# Patient Record
Sex: Female | Born: 1975 | Race: White | Hispanic: No | Marital: Married | Smoking: Former smoker
Health system: Southern US, Community
[De-identification: ages and names within clinical notes are randomized; demographics above are authoritative.]

## PROBLEM LIST (undated history)

## (undated) DIAGNOSIS — F32A Depression, unspecified: Secondary | ICD-10-CM

## (undated) DIAGNOSIS — M5030 Other cervical disc degeneration, unspecified cervical region: Secondary | ICD-10-CM

## (undated) DIAGNOSIS — D649 Anemia, unspecified: Secondary | ICD-10-CM

## (undated) DIAGNOSIS — G43909 Migraine, unspecified, not intractable, without status migrainosus: Secondary | ICD-10-CM

## (undated) HISTORY — PX: TUBAL LIGATION: SHX77

## (undated) HISTORY — PX: OTHER SURGICAL HISTORY: SHX169

## (undated) HISTORY — PX: MINOR HEMORRHOIDECTOMY: SHX6238

## (undated) HISTORY — PX: COSMETIC SURGERY: SHX468

## (undated) HISTORY — DX: Anemia, unspecified: D64.9

## (undated) HISTORY — PX: OOPHORECTOMY: SHX86

---

## 2016-03-27 ENCOUNTER — Ambulatory Visit (INDEPENDENT_AMBULATORY_CARE_PROVIDER_SITE_OTHER): Payer: Self-pay

## 2016-03-27 ENCOUNTER — Other Ambulatory Visit: Payer: Self-pay | Admitting: Emergency Medicine

## 2016-03-27 DIAGNOSIS — R52 Pain, unspecified: Secondary | ICD-10-CM

## 2016-03-27 DIAGNOSIS — M25521 Pain in right elbow: Secondary | ICD-10-CM

## 2019-08-15 ENCOUNTER — Emergency Department: Admit: 2019-08-15 | Discharge status: Absent without leave | Payer: Self-pay

## 2019-08-15 ENCOUNTER — Emergency Department (INDEPENDENT_AMBULATORY_CARE_PROVIDER_SITE_OTHER): Payer: 59

## 2019-08-15 ENCOUNTER — Other Ambulatory Visit: Payer: Self-pay

## 2019-08-15 ENCOUNTER — Emergency Department (INDEPENDENT_AMBULATORY_CARE_PROVIDER_SITE_OTHER)
Admission: EM | Admit: 2019-08-15 | Discharge: 2019-08-15 | Disposition: A | Discharge status: Home / Self Care | Payer: 59 | Source: Home / Self Care

## 2019-08-15 DIAGNOSIS — W1839XA Other fall on same level, initial encounter: Secondary | ICD-10-CM

## 2019-08-15 DIAGNOSIS — M25571 Pain in right ankle and joints of right foot: Secondary | ICD-10-CM | POA: Diagnosis not present

## 2019-08-15 DIAGNOSIS — S5011XA Contusion of right forearm, initial encounter: Secondary | ICD-10-CM | POA: Diagnosis not present

## 2019-08-15 DIAGNOSIS — W010XXA Fall on same level from slipping, tripping and stumbling without subsequent striking against object, initial encounter: Secondary | ICD-10-CM

## 2019-08-15 DIAGNOSIS — S93401A Sprain of unspecified ligament of right ankle, initial encounter: Secondary | ICD-10-CM | POA: Diagnosis not present

## 2019-08-15 NOTE — ED Provider Notes (Signed)
[name]    CSN: [id number] Arrival date & time: 08/15/19  1811      History   Chief Complaint Chief Complaint  Patient presents with  . Ankle Pain    HPI Julie Morris is a 44 y.o. female.   HPI  Julie Morris is a 43 y.o. female presenting to UC with c/o Right ankle pain that started earlier today after tripping and falling on a trailer attached to her husband's truck.  She hit the back of her head but denies LOC.  She also hit her Right forearm, mildly aching but ankle pain is most bothersome, aching, 6/10, worse with ambulating.    History reviewed. No pertinent past medical history.  There are no problems to display for this patient.   History reviewed. No pertinent surgical history.  OB History   No obstetric history on file.      Home Medications    Prior to Admission medications   Medication Sig Start Date End Date Taking? Authorizing Provider  Ascorbic Acid (VITAMIN C) 1000 MG tablet Take 1,000 mg by mouth daily.   Yes [provider]  busPIRone (BUSPAR) 15 MG tablet Take 15 mg by mouth 3 (three) times daily.   Yes [provider]  drospirenone-ethinyl estradiol (YAZ) 3-0.02 MG tablet Take 1 tablet by mouth daily.   Yes [provider]  escitalopram (LEXAPRO) 20 MG tablet Take 20 mg by mouth daily.   Yes [provider]  traMADol (ULTRAM) 50 MG tablet Take by mouth every 6 (six) hours as needed.   Yes [provider]  traZODone (DESYREL) 100 MG tablet Take 100 mg by mouth at bedtime.   Yes [provider]    Family History Family History  Problem Relation Age of Onset  . Diabetes Mother   . Hypertension Mother   . Emphysema Father     Social History Social History   Tobacco Use  . Smoking status: Never Smoker  . Smokeless tobacco: Never Used  Substance Use Topics  . Alcohol use: Not Currently  . Drug use: Not on file     Allergies   Patient has no known  allergies.   Review of Systems Review of Systems  Musculoskeletal: Positive for arthralgias and myalgias. Negative for back pain.  Skin: Positive for color change. Negative for wound.  Neurological: Positive for dizziness (initially after fall, has resolved.). Negative for light-headedness and headaches.     Physical Exam Triage Vital Signs ED Triage Vitals  Enc Vitals Group     BP 08/15/19 1835 122/79     Pulse Rate 08/15/19 1835 87     Resp 08/15/19 1835 16     Temp 08/15/19 1835 99.5 F (37.5 C)     Temp Source 08/15/19 1835 Oral     SpO2 08/15/19 1835 99 %     Weight --      Height --      Head Circumference --      Peak Flow --      Pain Score 08/15/19 1830 6     Pain Loc --      Pain Edu? --      Excl. in GC? --    No data found.  Updated Vital Signs BP 122/79 (BP Location: Right Arm)   Pulse 87   Temp 99.5 F (37.5 C) (Oral)   Resp 16   LMP 07/31/2019   SpO2 99%   Visual Acuity Right Eye Distance:   Left  Eye Distance:   Bilateral Distance:    Right Eye Near:   Left Eye Near:    Bilateral Near:     Physical Exam Vitals and nursing note reviewed.  Constitutional:      Appearance: Normal appearance. She is well-developed.  HENT:     Head: Normocephalic and atraumatic.     Right Ear: Tympanic membrane and ear canal normal.     Left Ear: Tympanic membrane and ear canal normal.     Nose: Nose normal.     Mouth/Throat:     Mouth: Mucous membranes are moist.     Pharynx: Oropharynx is clear.  Eyes:     Extraocular Movements: Extraocular movements intact.     Conjunctiva/sclera: Conjunctivae normal.     Pupils: Pupils are equal, round, and reactive to light.  Cardiovascular:     Rate and Rhythm: Normal rate.  Pulmonary:     Effort: Pulmonary effort is normal.  Musculoskeletal:        General: Tenderness present. Normal range of motion.     Cervical back: Normal range of motion.     Comments: Right forearm: mild tenderness to muscles, no bony  tenderness. Full ROM shoulder, elbow, wrist and fingers.  Right ankle: no edema, tenderness to lateral aspect. Full ROM.  No tenderness to foot.  Calf is soft, non-tender.   Skin:    General: Skin is warm and dry.     Capillary Refill: Capillary refill takes less than 2 seconds.     Findings: Bruising present.     Comments: Right forearm: faint ecchymosis.  Right ankle: 70mm abrasion, mild ecchymosis. Tenderness proximal to lateral malleolus.   Neurological:     General: No focal deficit present.     Mental Status: She is alert and oriented to person, place, and time.     Cranial Nerves: No cranial nerve deficit.     Sensory: No sensory deficit.  Psychiatric:        Behavior: Behavior normal.      UC Treatments / Results  Labs (all labs ordered are listed, but only abnormal results are displayed) Labs Reviewed - No data to display  EKG   Radiology DG Ankle Complete Right  Result Date: 08/15/2019 CLINICAL DATA:  Pain and inflammation EXAM: RIGHT ANKLE - COMPLETE 3+ VIEW COMPARISON:  None. FINDINGS: No fracture or malalignment. Mild lateral soft tissue swelling. Ankle mortise is symmetric IMPRESSION: No acute osseous abnormality. Electronically Signed   By: Jasmine Pang M.D.   On: 08/15/2019 19:23    Procedures Procedures (including critical care time)  Medications Ordered in UC Medications - No data to display  Initial Impression / Assessment and Plan / UC Course  I have reviewed the triage vital signs and the nursing notes.  Pertinent labs & imaging results that were available during my care of the patient were reviewed by me and considered in my medical decision making (see chart for details).     Reviewed imaging with pt Reassured pt no fracture, will tx as sprain ASO applied for comfort Pt declined crutches F/u Sports Medicine AVS provided.  Final Clinical Impressions(s) / UC Diagnoses   Final diagnoses:  Sprain of right ankle, unspecified ligament,  initial encounter  Contusion of right forearm, initial encounter  Fall from slip, trip, or stumble, initial encounter     Discharge Instructions      Call to schedule a follow up appointment with Sports Medicine in 1-2 weeks if not improving.  You may  take 500mg  acetaminophen every 4-6 hours or in combination with ibuprofen 400-600mg  every 6-8 hours as needed for pain and inflammation.     ED Prescriptions    None     PDMP not reviewed this encounter.   , Lurene Shadow 08/17/19 [phone]

## 2019-08-15 NOTE — ED Triage Notes (Signed)
Patient presents to Urgent Care with complaints of right ankle pain since earlier today. Patient reports as she was hopping out of hte back of a truck today, she lost her balance and hit her right elbow, right ankle, and back of her head. Pt denies LOC, but did feel dizzy and lay there a while before she got back up. Pt ambulatory upon arrival.

## 2019-08-15 NOTE — Discharge Instructions (Signed)
°  Call to schedule a follow up appointment with Sports Medicine in 1-2 weeks if not improving.  You may take 500mg  acetaminophen every 4-6 hours or in combination with ibuprofen 400-600mg  every 6-8 hours as needed for pain and inflammation.

## 2019-12-11 ENCOUNTER — Emergency Department
Admission: RE | Admit: 2019-12-11 | Discharge: 2019-12-11 | Disposition: A | Discharge status: Home / Self Care | Payer: 59 | Source: Ambulatory Visit | Attending: Family Medicine | Admitting: Family Medicine

## 2019-12-11 ENCOUNTER — Other Ambulatory Visit: Payer: Self-pay

## 2019-12-11 VITALS — BP 129/73 | HR 67 | Temp 99.2°F | Resp 18

## 2019-12-11 DIAGNOSIS — R300 Dysuria: Secondary | ICD-10-CM

## 2019-12-11 DIAGNOSIS — M940 Chondrocostal junction syndrome [Tietze]: Secondary | ICD-10-CM

## 2019-12-11 DIAGNOSIS — J0100 Acute maxillary sinusitis, unspecified: Secondary | ICD-10-CM

## 2019-12-11 HISTORY — DX: Depression, unspecified: F32.A

## 2019-12-11 HISTORY — DX: Other cervical disc degeneration, unspecified cervical region: M50.30

## 2019-12-11 LAB — POCT URINALYSIS DIP (MANUAL ENTRY)
Bilirubin, UA: NEGATIVE
Glucose, UA: NEGATIVE mg/dL
Ketones, POC UA: NEGATIVE mg/dL
Leukocytes, UA: NEGATIVE
Nitrite, UA: NEGATIVE
Protein Ur, POC: NEGATIVE mg/dL
Spec Grav, UA: >=1.03 — AB (ref 1.010–1.025)
Urobilinogen, UA: 0.2 E.U./dL
pH, UA: 6.5 (ref 5.0–8.0)

## 2019-12-11 MED ORDER — ALBUTEROL SULFATE HFA 108 (90 BASE) MCG/ACT IN AERS
2.0000 | INHALATION_SPRAY | Freq: Four times a day (QID) | RESPIRATORY_TRACT | 0 refills | Status: AC | PRN
Start: 2019-12-11 — End: ?

## 2019-12-11 MED ORDER — SULFAMETHOXAZOLE-TRIMETHOPRIM 800-160 MG PO TABS
1.0000 | ORAL_TABLET | Freq: Two times a day (BID) | ORAL | 0 refills | Status: DC
Start: 2019-12-11 — End: 2022-12-01

## 2019-12-11 NOTE — Discharge Instructions (Addendum)
Take plain guaifenesin (1200mg  extended release tabs such as Mucinex) twice daily, with plenty of water, for cough and congestion.  May add Pseudoephedrine (30mg , one or two every 4 to 6 hours) for sinus congestion.  Get adequate rest.   May use Afrin nasal spray (or generic oxymetazoline) each morning for about 5 days and then discontinue.  Also recommend using saline nasal spray several times daily and saline nasal irrigation (AYR is a common brand).  Use Flonase nasal spray each morning after using Afrin nasal spray and saline nasal irrigation. Try warm salt water gargles for sore throat.  Stop all antihistamines (Nyquil, etc) for now, and other non-prescription cough/cold preparations. May take Ibuprofen 200mg , 4 tabs every 8 hours with food for chest/sternum discomfort. May take Delsym Cough Suppressant ("12 Hour Cough Relief") at bedtime for nighttime cough.

## 2019-12-11 NOTE — ED Provider Notes (Signed)
[name]    CSN: [id number] Arrival date & time: 12/11/19  0840      History   Chief Complaint Chief Complaint  Patient presents with  . Dysuria  . Nasal Congestion    HPI Julie Morris is a 44 y.o. female.   Patient complains of cough and nasal congestion that started about 1.5 weeks ago.  She visited her PCP at that time where she had negative flu and COVID tests.  She was prescribed prednisone for 5 days which was somewhat helpful, but she still has sinus congestion with greenish mucous, productive cough, and wheezing. During the past 3 to 4 days she has developed dysuria, urgency, frequency, and low grade fever.  The history is provided by the patient.    Past Medical History:  Diagnosis Date  . Degenerative disc disease, cervical   . Depression     There are no problems to display for this patient.   Past Surgical History:  Procedure Laterality Date  . MINOR HEMORRHOIDECTOMY    . OOPHORECTOMY     R ovary d/t ectopic pregnancy  . TUBAL LIGATION    . tubal reversal      OB History   No obstetric history on file.      Home Medications    Prior to Admission medications   Medication Sig Start Date End Date Taking? Authorizing Provider  Biotin 10 MG TABS Take 1 tablet by mouth daily.   Yes [provider]  DANDELION ROOT PO Take 3 tablets by mouth daily.   Yes [provider]  Ginkgo Biloba 40 MG TABS Take 1 tablet by mouth daily.   Yes [provider]  glucosamine-chondroitin 500-400 MG tablet Take 2 tablets by mouth daily.   Yes [provider]  tiZANidine (ZANAFLEX) 2 MG tablet Take 2 mg by mouth daily as needed for muscle spasms.   Yes [provider]  albuterol (VENTOLIN HFA) 108 (90 Base) MCG/ACT inhaler Inhale 2 puffs into the lungs every 6 (six) hours as needed for wheezing or shortness of breath. 12/11/19   Lattie Haw, MD  Ascorbic Acid (VITAMIN C) 1000 MG tablet Take 1,000 mg by  mouth daily.    [provider]  busPIRone (BUSPAR) 15 MG tablet Take 15 mg by mouth 3 (three) times daily.    [provider]  drospirenone-ethinyl estradiol (YAZ) 3-0.02 MG tablet Take 1 tablet by mouth daily.    [provider]  escitalopram (LEXAPRO) 20 MG tablet Take 20 mg by mouth daily.    [provider]  sulfamethoxazole-trimethoprim (BACTRIM DS) 800-160 MG tablet Take 1 tablet by mouth 2 (two) times daily. 12/11/19   Lattie Haw, MD  traMADol (ULTRAM) 50 MG tablet Take by mouth every 6 (six) hours as needed.    [provider]  traZODone (DESYREL) 100 MG tablet Take 100 mg by mouth at bedtime.    [provider]    Family History Family History  Problem Relation Age of Onset  . Diabetes Mother   . Hypertension Mother   . Emphysema Father     Social History Social History   Tobacco Use  . Smoking status: Former Games developer  . Smokeless tobacco: Never Used  Vaping Use  . Vaping Use: Every day  Substance Use Topics  . Alcohol use: Not Currently  . Drug use: Not Currently     Allergies   Patient has no known allergies.   Review of Systems Review of  Systems  + sore throat + cough No pleuritic pain + wheezing + nasal congestion + post-nasal drainage + sinus pain/pressure No itchy/red eyes No earache No hemoptysis No SOB + low grade fever, + chills No nausea No vomiting No abdominal pain No diarrhea + urinary symptoms No skin rash + fatigue No myalgias No headache Used OTC meds (Nyquil, Daquil, Mucinex) without relief    Physical Exam Triage Vital Signs ED Triage Vitals  Enc Vitals Group     BP 12/11/19 0905 129/73     Pulse Rate 12/11/19 0905 67     Resp 12/11/19 0905 18     Temp 12/11/19 0905 99.2 F (37.3 C)     Temp Source 12/11/19 0905 Oral     SpO2 12/11/19 0905 97 %     Weight --      Height --      Head Circumference --      Peak Flow --      Pain Score 12/11/19 0855 3      Pain Loc --      Pain Edu? --      Excl. in GC? --    No data found.  Updated Vital Signs BP 129/73 (BP Location: Left Arm)   Pulse 67   Temp 99.2 F (37.3 C) (Oral)   Resp 18   LMP  (LMP Unknown)   SpO2 97%   Visual Acuity Right Eye Distance:   Left Eye Distance:   Bilateral Distance:    Right Eye Near:   Left Eye Near:    Bilateral Near:     Physical Exam Nursing notes and Vital Signs reviewed. Appearance:  Patient appears stated age, and in no acute distress Eyes:  Pupils are equal, round, and reactive to light and accomodation.  Extraocular movement is intact.  Conjunctivae are not inflamed  Ears:  Canals normal.  Tympanic membranes normal.  Nose:  Mildly congested turbinates.  Maxillary sinus tenderness is present.  Pharynx:  Normal Neck:  Supple.  Mildly enlarged lateral nodes are present, tender to palpation on the left.   Lungs:  Clear to auscultation.  Breath sounds are equal.  Moving air well. Chest:  Distinct tenderness to palpation over the mid-sternum.  Heart:  Regular rate and rhythm without murmurs, rubs, or gallops.  Abdomen:  Nontender without masses or hepatosplenomegaly.  Bowel sounds are present.  No CVA or flank tenderness.  Extremities:  No edema.  Skin:  No rash present.   UC Treatments / Results  Labs (all labs ordered are listed, but only abnormal results are displayed) Labs Reviewed  POCT URINALYSIS DIP (MANUAL ENTRY) - Abnormal; Notable for the following components:      Result Value   Spec Grav, UA >=1.030 (*)    Blood, UA trace-lysed (*)    All other components within normal limits  URINE CULTURE    EKG   Radiology No results found.  Procedures Procedures (including critical care time)  Medications Ordered in UC Medications - No data to display  Initial Impression / Assessment and Plan / UC Course  I have reviewed the triage vital signs and the nursing notes.  Pertinent labs & imaging results that were available during my  care of the patient were reviewed by me and considered in my medical decision making (see chart for details).    Urine culture pending.  Begin empiric Bactrim DS.  Rx for albuterol inhaler. Followup with Family Doctor if not improved in about 8 days.  Final Clinical Impressions(s) / UC Diagnoses   Final diagnoses:  Dysuria  Acute maxillary sinusitis, recurrence not specified  Costochondritis     Discharge Instructions     Take plain guaifenesin (1200mg  extended release tabs such as Mucinex) twice daily, with plenty of water, for cough and congestion.  May add Pseudoephedrine (30mg , one or two every 4 to 6 hours) for sinus congestion.  Get adequate rest.   May use Afrin nasal spray (or generic oxymetazoline) each morning for about 5 days and then discontinue.  Also recommend using saline nasal spray several times daily and saline nasal irrigation (AYR is a common brand).  Use Flonase nasal spray each morning after using Afrin nasal spray and saline nasal irrigation. Try warm salt water gargles for sore throat.  Stop all antihistamines (Nyquil, etc) for now, and other non-prescription cough/cold preparations. May take Ibuprofen 200mg , 4 tabs every 8 hours with food for chest/sternum discomfort. May take Delsym Cough Suppressant ("12 Hour Cough Relief") at bedtime for nighttime cough.     ED Prescriptions    Medication Sig Dispense Auth. Provider   sulfamethoxazole-trimethoprim (BACTRIM DS) 800-160 MG tablet Take 1 tablet by mouth 2 (two) times daily. 20 tablet , MD   albuterol (VENTOLIN HFA) 108 (90 Base) MCG/ACT inhaler Inhale 2 puffs into the lungs every 6 (six) hours as needed for wheezing or shortness of breath. 8 g , MD        , MD 12/21/19 1041

## 2019-12-11 NOTE — ED Triage Notes (Signed)
Pt presents to Urgent Care with c/o dysuria, low back pain, and low-grade fever x 3- 4 days.  Pt also c/o nasal congestion and occasional cough x 1.5 weeks. She reports productive cough and states she has greenish nasal mucus. She saw her PCP approx 10 days ago and was tested for COVID and flu, and both were negative. She took course of Prednisone x 5 days, which she reports helped some.

## 2019-12-14 LAB — URINE CULTURE
MICRO NUMBER:: 11236333
Result:: NO GROWTH
SPECIMEN QUALITY:: ADEQUATE

## 2020-07-30 DIAGNOSIS — U071 COVID-19: Secondary | ICD-10-CM

## 2020-07-30 HISTORY — DX: COVID-19: U07.1

## 2020-11-05 ENCOUNTER — Other Ambulatory Visit: Payer: Self-pay

## 2020-11-05 ENCOUNTER — Emergency Department
Admission: RE | Admit: 2020-11-05 | Discharge: 2020-11-05 | Disposition: A | Discharge status: Home / Self Care | Payer: 59 | Source: Ambulatory Visit

## 2020-11-05 VITALS — BP 124/84 | HR 78 | Temp 99.3°F | Resp 16 | Ht 67.0 in | Wt 150.0 lb

## 2020-11-05 DIAGNOSIS — J3489 Other specified disorders of nose and nasal sinuses: Secondary | ICD-10-CM | POA: Diagnosis not present

## 2020-11-05 DIAGNOSIS — J0100 Acute maxillary sinusitis, unspecified: Secondary | ICD-10-CM

## 2020-11-05 DIAGNOSIS — J309 Allergic rhinitis, unspecified: Secondary | ICD-10-CM

## 2020-11-05 HISTORY — DX: Migraine, unspecified, not intractable, without status migrainosus: G43.909

## 2020-11-05 MED ORDER — AMOXICILLIN-POT CLAVULANATE 875-125 MG PO TABS: 1.0000 | ORAL_TABLET | Freq: Two times a day (BID) | ORAL | 0 refills | Status: AC

## 2020-11-05 MED ORDER — PREDNISONE 20 MG PO TABS
ORAL_TABLET | ORAL | 0 refills | Status: DC
Start: 2020-11-05 — End: 2020-11-19

## 2020-11-05 MED ORDER — FEXOFENADINE HCL 180 MG PO TABS
180.0000 mg | ORAL_TABLET | Freq: Every day | ORAL | 0 refills | Status: DC
Start: 2020-11-05 — End: 2021-03-26

## 2020-11-05 MED ORDER — ACETAMINOPHEN 325 MG PO TABS
650.0000 mg | ORAL_TABLET | Freq: Once | ORAL | Status: AC
  Administered 2020-11-05: 650 mg via ORAL

## 2020-11-05 NOTE — Discharge Instructions (Addendum)
Instructed patient to discontinue OTC chlorphen-PE-acetaminophen tabs and Flonase now.  Advised patient to take medication as directed with food to completion.  Advised patient to take prednisone burst and Allegra with first dose of antibiotic for 5 of 10 days.  Encouraged patient to increase daily water intake while taking these medications.

## 2020-11-05 NOTE — ED Notes (Signed)
Pt had a question about an injection for knee pain x  3 weeks - provider updated - given information for Sports medicine practices

## 2020-11-05 NOTE — ED Triage Notes (Signed)
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

## 2020-11-05 NOTE — ED Provider Notes (Signed)
[name]    CSN: [id number] Arrival date & time: 11/05/20  0908      History   Chief Complaint Chief Complaint  Patient presents with   Facial Pain    HPI Julie Morris is a 45 y.o. female.   HPI 45 year old female presents with sinus nasal congestion for 1 month.  Past Medical History:  Diagnosis Date   COVID 07/30/2020   no vaccine   Degenerative disc disease, cervical    Depression    Migraine     There are no problems to display for this patient.   Past Surgical History:  Procedure Laterality Date   MINOR HEMORRHOIDECTOMY     OOPHORECTOMY     R ovary d/t ectopic pregnancy   TUBAL LIGATION     tubal reversal      OB History   No obstetric history on file.      Home Medications    Prior to Admission medications   Medication Sig Start Date End Date Taking? Authorizing Provider  amoxicillin-clavulanate (AUGMENTIN) 875-125 MG tablet Take 1 tablet by mouth 2 (two) times daily for 10 days. 11/05/20 11/15/20 Yes Trevor Iha, FNP  baclofen (LIORESAL) 10 MG tablet Take by mouth. 08/22/20 08/22/21 Yes [provider]  celecoxib (CELEBREX) 200 MG capsule Take 1 capsule by mouth 2 (two) times daily. 10/04/20  Yes [provider]  Chlorphen-PE-Acetaminophen 4-10-325 MG TABS Take 1 tablet 4 times daily for sinus drainage/congestion and sinus headache. 07/30/20  Yes [provider]  DULoxetine (CYMBALTA) 60 MG capsule duloxetine 60 mg capsule,delayed release  TAKE 1 CAPSULE BY MOUTH ONCE DAILY 03/23/20  Yes [provider]  fexofenadine (ALLEGRA ALLERGY) 180 MG tablet Take 1 tablet (180 mg total) by mouth daily for 15 days. 11/05/20 11/20/20 Yes Trevor Iha, FNP  fluticasone (FLONASE) 50 MCG/ACT nasal spray Use 1 spray(s) in each nostril once daily 10/10/19  Yes [provider]  predniSONE (DELTASONE) 20 MG tablet Take 3 tabs PO x 5 days. 11/05/20  Yes Trevor Iha, FNP  promethazine (PHENERGAN) 25 MG tablet  Take by mouth. 08/22/20  Yes [provider]  rizatriptan (MAXALT) 10 MG tablet Take by mouth. 08/22/20  Yes [provider]  topiramate (TOPAMAX) 25 MG tablet Take 1 po qhs x 2 weeks, then 2 po qhs x 2 weeks, then 3 po qhs. 08/22/20  Yes [provider]  albuterol (VENTOLIN HFA) 108 (90 Base) MCG/ACT inhaler Inhale 2 puffs into the lungs every 6 (six) hours as needed for wheezing or shortness of breath. Patient not taking: Reported on 11/05/2020 12/11/19   Lattie Haw, MD  Ascorbic Acid (VITAMIN C) 1000 MG tablet Take 1,000 mg by mouth daily.    [provider]  Biotin 10 MG TABS Take 1 tablet by mouth daily.    [provider]  busPIRone (BUSPAR) 15 MG tablet Take 15 mg by mouth 3 (three) times daily. Patient not taking: Reported on 11/05/2020    [provider]  Cholecalciferol 25 MCG (1000 UT) tablet Take by mouth.    [provider]  DANDELION ROOT PO Take 3 tablets by mouth daily. Patient not taking: Reported on 11/05/2020    [provider]  drospirenone-ethinyl estradiol (YAZ) 3-0.02 MG tablet Take 1 tablet by mouth daily.    [provider]  escitalopram (LEXAPRO) 20 MG tablet Take 20 mg by mouth daily. Patient not taking: Reported on 11/05/2020    [provider]  Ginkgo Biloba 40  MG TABS Take 1 tablet by mouth daily.    [provider]  glucosamine-chondroitin 500-400 MG tablet Take 2 tablets by mouth daily. Patient not taking: Reported on 11/05/2020    [provider]  HYDROcodone-acetaminophen (NORCO) 10-325 MG tablet Take 1 tablet by mouth 3 (three) times daily as needed. 10/25/20   [provider]  hydrOXYzine (ATARAX/VISTARIL) 10 MG tablet Take 10 mg by mouth 2 (two) times daily. 10/24/20   [provider]  naloxone (NARCAN) nasal spray 4 mg/0.1 mL Narcan 4 mg/actuation nasal spray  ADMINISTER A SINGLE SPRAY IN ONE NOSTRIL UPON SIGNS OF OPIOID OVERDOSE. CALL  911. REPEAT AFTER 3 MINUTES IF NO RESPONSE.    [provider]  sulfamethoxazole-trimethoprim (BACTRIM DS) 800-160 MG tablet Take 1 tablet by mouth 2 (two) times daily. Patient not taking: Reported on 11/05/2020 12/11/19   Lattie Haw, MD  tiZANidine (ZANAFLEX) 2 MG tablet Take 2 mg by mouth daily as needed for muscle spasms. Patient not taking: Reported on 11/05/2020    [provider]  traMADol (ULTRAM) 50 MG tablet Take by mouth every 6 (six) hours as needed. Patient not taking: Reported on 11/05/2020    [provider]  traZODone (DESYREL) 100 MG tablet Take 100 mg by mouth at bedtime.    [provider]    Family History Family History  Problem Relation Age of Onset   Diabetes Mother    Hypertension Mother    Emphysema Father     Social History Social History   Tobacco Use   Smoking status: Former   Smokeless tobacco: Never  Building services engineer Use: Every day  Substance Use Topics   Alcohol use: Not Currently   Drug use: Not Currently     Allergies   Patient has no known allergies.   Review of Systems Review of Systems  HENT:  Positive for congestion, postnasal drip and sinus pain.   Respiratory:  Positive for cough.   All other systems reviewed and are negative.   Physical Exam Triage Vital Signs ED Triage Vitals  Enc Vitals Group     BP 11/05/20 0931 124/84     Pulse Rate 11/05/20 0931 78     Resp 11/05/20 0931 16     Temp 11/05/20 0931 99.3 F (37.4 C)     Temp Source 11/05/20 0931 Oral     SpO2 11/05/20 0931 97 %     Weight 11/05/20 0934 150 lb (68 kg)     Height 11/05/20 0934 5\' 7"  (1.702 m)     Head Circumference --      Peak Flow --      Pain Score 11/05/20 0934 5     Pain Loc --      Pain Edu? --      Excl. in GC? --    No data found.  Updated Vital Signs BP 124/84 (BP Location: Left Arm)   Pulse 78   Temp 99.3 F (37.4 C) (Oral)   Resp 16   Ht 5\' 7"  (1.702 m)   Wt 150 lb (68 kg)   LMP  10/31/2020 (Approximate)   SpO2 97%   BMI 23.49 kg/m    Physical Exam Vitals and nursing note reviewed.  Constitutional:      General: She is not in acute distress.    Appearance: Normal appearance. She is normal weight. She is not ill-appearing.  HENT:     Head: Normocephalic and atraumatic.  Right Ear: Tympanic membrane, ear canal and external ear normal.     Left Ear: Tympanic membrane, ear canal and external ear normal.     Nose:     Right Sinus: Maxillary sinus tenderness present.     Left Sinus: Maxillary sinus tenderness present.     Mouth/Throat:     Mouth: Mucous membranes are moist.     Pharynx: Oropharynx is clear.     Comments: Moderate amount of clear drainage of posterior oropharynx noted Eyes:     Extraocular Movements: Extraocular movements intact.     Conjunctiva/sclera: Conjunctivae normal.     Pupils: Pupils are equal, round, and reactive to light.  Cardiovascular:     Rate and Rhythm: Normal rate and regular rhythm.     Pulses: Normal pulses.     Heart sounds: Normal heart sounds.  Pulmonary:     Effort: Pulmonary effort is normal.     Breath sounds: Normal breath sounds. No wheezing, rhonchi or rales.  Musculoskeletal:        General: Normal range of motion.     Cervical back: Normal range of motion and neck supple.  Skin:    General: Skin is warm and dry.  Neurological:     General: No focal deficit present.     Mental Status: She is alert and oriented to person, place, and time. Mental status is at baseline.     UC Treatments / Results  Labs (all labs ordered are listed, but only abnormal results are displayed) Labs Reviewed - No data to display  EKG   Radiology No results found.  Procedures Procedures (including critical care time)  Medications Ordered in UC Medications  acetaminophen (TYLENOL) tablet 650 mg (650 mg Oral Given 11/05/20 0946)    Initial Impression / Assessment and Plan / UC Course  I have reviewed the triage  vital signs and the nursing notes.  Pertinent labs & imaging results that were available during my care of the patient were reviewed by me and considered in my medical decision making (see chart for details).     MDM: 1.  Acute maxillary sinusitis-Rx'd Augmentin; 2.  Sinus pressure-Rx'd Prednisone burst; 3.  Allergic rhinitis-to Allegra.  Instructed patient to discontinue OTC chlorphen-PE-acetaminophen tabs and Flonase now.  Advised patient to take medication as directed with food to completion.  Advised patient to take prednisone burst and Allegra with first dose of antibiotic for 5 of 10 days.  Encouraged patient to increase daily water intake while taking these medications.  Patient discharged home, hemodynamically stable. Final Clinical Impressions(s) / UC Diagnoses   Final diagnoses:  Acute maxillary sinusitis, recurrence not specified  Sinus pressure  Allergic rhinitis, unspecified seasonality, unspecified trigger     Discharge Instructions      Instructed patient to discontinue OTC chlorphen-PE-acetaminophen tabs and Flonase now.  Advised patient to take medication as directed with food to completion.  Advised patient to take prednisone burst and Allegra with first dose of antibiotic for 5 of 10 days.  Encouraged patient to increase daily water intake while taking these medications.     ED Prescriptions     Medication Sig Dispense Auth. Provider   amoxicillin-clavulanate (AUGMENTIN) 875-125 MG tablet Take 1 tablet by mouth 2 (two) times daily for 10 days. 20 tablet Trevor Iha, FNP   predniSONE (DELTASONE) 20 MG tablet Take 3 tabs PO x 5 days. 15 tablet Trevor Iha, FNP   fexofenadine [facility] ALLERGY) 180 MG tablet Take 1 tablet (180 mg  total) by mouth daily for 15 days. 15 tablet Trevor Iha, FNP      PDMP not reviewed this encounter.   Trevor Iha, FNP 11/05/20 1027

## 2020-11-19 ENCOUNTER — Other Ambulatory Visit: Payer: Self-pay

## 2020-11-19 ENCOUNTER — Emergency Department (INDEPENDENT_AMBULATORY_CARE_PROVIDER_SITE_OTHER): Payer: 59

## 2020-11-19 ENCOUNTER — Emergency Department (INDEPENDENT_AMBULATORY_CARE_PROVIDER_SITE_OTHER)
Admission: EM | Admit: 2020-11-19 | Discharge: 2020-11-19 | Disposition: A | Discharge status: Home / Self Care | Payer: 59 | Source: Home / Self Care | Attending: Family Medicine | Admitting: Family Medicine

## 2020-11-19 DIAGNOSIS — M25512 Pain in left shoulder: Secondary | ICD-10-CM

## 2020-11-19 DIAGNOSIS — G8929 Other chronic pain: Secondary | ICD-10-CM

## 2020-11-19 MED ORDER — PREDNISONE 20 MG PO TABS: ORAL_TABLET | ORAL | 0 refills | Status: DC
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
## 2020-11-19 NOTE — Discharge Instructions (Signed)
Apply ice pack for 20 to 30 minutes, 3 to 4 times daily  Continue until pain and swelling decrease.  °
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
## 2020-11-20 ENCOUNTER — Ambulatory Visit: Payer: 59

## 2020-11-20 NOTE — ED Provider Notes (Signed)
[name]    CSN: [id number] Arrival date & time: 11/19/20  1952      History   Chief Complaint Chief Complaint  Patient presents with   Shoulder Pain    left    HPI Julie Morris is a 45 y.o. female.   Patient complains of approximately 6 month history of left shoulder pain and limited range of motion, now worse during the past several weeks.  She denies specific injury but admits that she has had several falls.  The pain now affects her sleep.  The history is provided by the patient.  Shoulder Pain Location:  Shoulder Shoulder location:  L shoulder Injury: no   Pain details:    Quality:  Aching   Radiates to:  Does not radiate   Severity:  Severe   Onset quality:  Gradual   Duration:  6 months   Timing:  Constant   Progression:  Worsening Handedness:  Right-handed Dislocation: no   Prior injury to area:  No Relieved by:  Nothing Worsened by:  Movement Ineffective treatments:  Narcotics Associated symptoms: back pain, decreased range of motion and muscle weakness   Associated symptoms: no fatigue, no fever and no numbness    Past Medical History:  Diagnosis Date   COVID 07/30/2020   no vaccine   Degenerative disc disease, cervical    Depression    Migraine     There are no problems to display for this patient.   Past Surgical History:  Procedure Laterality Date   MINOR HEMORRHOIDECTOMY     OOPHORECTOMY     R ovary d/t ectopic pregnancy   TUBAL LIGATION     tubal reversal      OB History   No obstetric history on file.      Home Medications    Prior to Admission medications   Medication Sig Start Date End Date Taking? Authorizing Provider  predniSONE (DELTASONE) 20 MG tablet Take one tab by mouth twice daily for 5 days, then one daily. Take with food. 11/19/20  Yes Lattie Haw, MD  albuterol (VENTOLIN HFA) 108 (90 Base) MCG/ACT inhaler Inhale 2 puffs into the lungs every 6 (six) hours as needed for wheezing or shortness of  breath. 12/11/19   Lattie Haw, MD  Ascorbic Acid (VITAMIN C) 1000 MG tablet Take 1,000 mg by mouth daily.    [provider]  baclofen (LIORESAL) 10 MG tablet Take by mouth. 08/22/20 08/22/21  [provider]  Biotin 10 MG TABS Take 1 tablet by mouth daily.    [provider]  busPIRone (BUSPAR) 15 MG tablet Take 15 mg by mouth 3 (three) times daily. Patient not taking: No sig reported    [provider]  celecoxib (CELEBREX) 200 MG capsule Take 1 capsule by mouth 2 (two) times daily. 10/04/20   [provider]  Chlorphen-PE-Acetaminophen 4-10-325 MG TABS Take 1 tablet 4 times daily for sinus drainage/congestion and sinus headache. 07/30/20   [provider]  Cholecalciferol 25 MCG (1000 UT) tablet Take by mouth.    [provider]  DANDELION ROOT PO Take 3 tablets by mouth daily. Patient not taking: Reported on 11/05/2020    [provider]  drospirenone-ethinyl estradiol (YAZ) 3-0.02 MG tablet Take 1 tablet by mouth daily.    [provider]  DULoxetine (CYMBALTA) 60 MG capsule duloxetine 60 mg capsule,delayed release  TAKE 1 CAPSULE BY MOUTH ONCE DAILY 03/23/20   [provider]  escitalopram (LEXAPRO)  20 MG tablet Take 20 mg by mouth daily. Patient not taking: No sig reported    [provider]  fexofenadine (ALLEGRA ALLERGY) 180 MG tablet Take 1 tablet (180 mg total) by mouth daily for 15 days. 11/05/20 11/20/20  Trevor Iha, FNP  fluticasone (FLONASE) 50 MCG/ACT nasal spray Use 1 spray(s) in each nostril once daily 10/10/19   [provider]  Ginkgo Biloba 40 MG TABS Take 1 tablet by mouth daily.    [provider]  glucosamine-chondroitin 500-400 MG tablet Take 2 tablets by mouth daily. Patient not taking: Reported on 11/05/2020    [provider]  HYDROcodone-acetaminophen (NORCO) 10-325 MG tablet Take 1 tablet by mouth 3 (three) times daily as needed. 10/25/20    [provider]  hydrOXYzine (ATARAX/VISTARIL) 10 MG tablet Take 10 mg by mouth 2 (two) times daily. 10/24/20   [provider]  naloxone (NARCAN) nasal spray 4 mg/0.1 mL Narcan 4 mg/actuation nasal spray  ADMINISTER A SINGLE SPRAY IN ONE NOSTRIL UPON SIGNS OF OPIOID OVERDOSE. CALL 911. REPEAT AFTER 3 MINUTES IF NO RESPONSE.    [provider]  promethazine (PHENERGAN) 25 MG tablet Take by mouth. 08/22/20   [provider]  rizatriptan (MAXALT) 10 MG tablet Take by mouth. 08/22/20   [provider]  sulfamethoxazole-trimethoprim (BACTRIM DS) 800-160 MG tablet Take 1 tablet by mouth 2 (two) times daily. Patient not taking: No sig reported 12/11/19   Lattie Haw, MD  tiZANidine (ZANAFLEX) 2 MG tablet Take 2 mg by mouth daily as needed for muscle spasms. Patient not taking: No sig reported    [provider]  topiramate (TOPAMAX) 25 MG tablet Take 1 po qhs x 2 weeks, then 2 po qhs x 2 weeks, then 3 po qhs. 08/22/20   [provider]  traMADol (ULTRAM) 50 MG tablet Take by mouth every 6 (six) hours as needed. Patient not taking: No sig reported    [provider]  traZODone (DESYREL) 100 MG tablet Take 100 mg by mouth at bedtime.    [provider]    Family History Family History  Problem Relation Age of Onset   Diabetes Mother    Hypertension Mother    Emphysema Father     Social History Social History   Tobacco Use   Smoking status: Former    Types: Cigarettes   Smokeless tobacco: Never  Vaping Use   Vaping Use: Every day  Substance Use Topics   Alcohol use: Not Currently   Drug use: Not Currently     Allergies   Patient has no known allergies.   Review of Systems Review of Systems  Constitutional:  Negative for chills, diaphoresis, fatigue, fever and unexpected weight change.  Musculoskeletal:  Positive for back pain.       Left shoulder pain  All other systems reviewed and are  negative.   Physical Exam Triage Vital Signs ED Triage Vitals  Enc Vitals Group     BP 11/19/20 2105 130/90     Pulse Rate 11/19/20 2105 77     Resp 11/19/20 2105 20     Temp 11/19/20 2105 98.3 F (36.8 C)     Temp Source 11/19/20 2105 Oral     SpO2 11/19/20 2105 100 %     Weight 11/19/20 2059 148 lb (67.1 kg)     Height 11/19/20 2059 5\' 7"  (1.702 m)     Head Circumference --      Peak Flow --  Pain Score 11/19/20 2057 10     Pain Loc --      Pain Edu? --      Excl. in GC? --    No data found.  Updated Vital Signs BP 130/90 (BP Location: Right Arm)   Pulse 77   Temp 98.3 F (36.8 C) (Oral)   Resp 20   Ht 5\' 7"  (1.702 m)   Wt 67.1 kg   LMP 10/31/2020 (Approximate)   SpO2 100%   BMI 23.18 kg/m   Visual Acuity Right Eye Distance:   Left Eye Distance:   Bilateral Distance:    Right Eye Near:   Left Eye Near:    Bilateral Near:     Physical Exam Vitals and nursing note reviewed.  Constitutional:      General: She is not in acute distress. HENT:     Head: Normocephalic.  Eyes:     Pupils: Pupils are equal, round, and reactive to light.  Cardiovascular:     Rate and Rhythm: Normal rate and regular rhythm.     Heart sounds: Normal heart sounds.  Pulmonary:     Breath sounds: Normal breath sounds.  Abdominal:     Palpations: Abdomen is soft.     Tenderness: There is no abdominal tenderness.  Musculoskeletal:     Left shoulder: Tenderness present. No deformity, bony tenderness or crepitus. Decreased range of motion. Decreased strength.     Cervical back: Neck supple.     Comments: Left scapular winging present.  Tenderness along left rhomboid muscles.  Left shoulder has no distinct tenderness to palpation, although there is some tenderness over the left trapezius muscle. Patient unable to actively or passively abduct above horizontal. Decreased external rotation.  Unable to perform Apley's test.  Positive empty can test. Distal neurovascular function  is intact.   Lymphadenopathy:     Cervical: No cervical adenopathy.  Skin:    General: Skin is warm and dry.     Findings: No rash.  Neurological:     General: No focal deficit present.     Mental Status: She is alert.     UC Treatments / Results  Labs (all labs ordered are listed, but only abnormal results are displayed) Labs Reviewed - No data to display  EKG   Radiology DG Shoulder Left  Result Date: 11/19/2020 CLINICAL DATA:  Fall fall, left shoulder pain EXAM: LEFT SHOULDER - 2+ VIEW COMPARISON:  None. FINDINGS: There is no evidence of fracture or dislocation. There is no evidence of arthropathy or other focal bone abnormality. Soft tissues are unremarkable. IMPRESSION: Negative. Electronically Signed   By: Helyn Numbers M.D.   On: 11/19/2020 20:36    Procedures Procedures (including critical care time)  Medications Ordered in UC Medications - No data to display  Initial Impression / Assessment and Plan / UC Course  I have reviewed the triage vital signs and the nursing notes.  Pertinent labs & imaging results that were available during my care of the patient were reviewed by me and considered in my medical decision making (see chart for details).    ?rotator cuff injury Note left scapular winging.  ?nerve damage from multiple falls. Begin prednisone burst/taper. Followup with Dr[facility] ([facility] for followup and further evaluation. Final Clinical Impressions(s) / UC Diagnoses   Final diagnoses:  Chronic left shoulder pain     Discharge Instructions      Apply ice pack for 20 to 30 minutes, 3 to 4  times daily  Continue until pain and swelling decrease.    ED Prescriptions     Medication Sig Dispense Auth. Provider   predniSONE (DELTASONE) 20 MG tablet Take one tab by mouth twice daily for 5 days, then one daily. Take with food. 15 tablet Lattie Haw, MD         Lattie Haw, MD 11/20/20 [id number]

## 2021-03-26 ENCOUNTER — Other Ambulatory Visit: Payer: Self-pay

## 2021-03-26 ENCOUNTER — Emergency Department
Admission: RE | Admit: 2021-03-26 | Discharge: 2021-03-26 | Disposition: A | Discharge status: Home / Self Care | Payer: 59 | Source: Ambulatory Visit

## 2021-03-26 VITALS — BP 132/83 | HR 85 | Temp 98.4°F | Resp 17

## 2021-03-26 DIAGNOSIS — J0100 Acute maxillary sinusitis, unspecified: Secondary | ICD-10-CM

## 2021-03-26 DIAGNOSIS — R059 Cough, unspecified: Secondary | ICD-10-CM

## 2021-03-26 DIAGNOSIS — J309 Allergic rhinitis, unspecified: Secondary | ICD-10-CM

## 2021-03-26 DIAGNOSIS — J3489 Other specified disorders of nose and nasal sinuses: Secondary | ICD-10-CM

## 2021-03-26 MED ORDER — AMOXICILLIN-POT CLAVULANATE 875-125 MG PO TABS: 1.0000 | ORAL_TABLET | Freq: Two times a day (BID) | ORAL | 0 refills | Status: AC

## 2021-03-26 MED ORDER — FEXOFENADINE HCL 180 MG PO TABS: 180.0000 mg | ORAL_TABLET | Freq: Every day | ORAL | 0 refills | Status: DC

## 2021-03-26 MED ORDER — BENZONATATE 200 MG PO CAPS: 200.0000 mg | ORAL_CAPSULE | Freq: Three times a day (TID) | ORAL | 0 refills | Status: AC | PRN

## 2021-03-26 MED ORDER — PREDNISONE 20 MG PO TABS: ORAL_TABLET | ORAL | 0 refills | Status: DC

## 2021-03-26 NOTE — ED Triage Notes (Signed)
Pt c/o cough, congestion and sore throat x 1 month. Has been tx already with antibiotic and prednisone. Sxs worsening in last week. Taking sudafed and Mucinex prn. Covid neg at home 3-4 days ago.  ?

## 2021-03-26 NOTE — Discharge Instructions (Addendum)
Advised patient to take medication as directed with food to completion.  Advised patient to take prednisone and Allegra with first dose of Augmentin for the next 5 of 10 days.  Advised may use Allegra as needed afterwards for concurrent postnasal drainage/drip.  Advised may take Tessalon Perles daily or as needed for cough.  Encouraged patient to increase daily water intake while taking these medications.  Advised patient if symptoms worsen and/or unresolved please follow-up with ENT for further evaluation. ?

## 2021-03-26 NOTE — ED Provider Notes (Signed)
[name]    CSN: [id number] Arrival date & time: 03/26/21  1156      History   Chief Complaint Chief Complaint  Patient presents with   Cough   Nasal Congestion   Sore Throat    HPI Julie Morris is a 46 y.o. female.   HPI 46 year old female presents with cough, congestion and sore throat for 1 month.  Patient reports being treated previously with antibiotic (Amoxicillin prescribed by her PCP), reports discontinuing this medication after several days as she saw no improvement from it.  Patient reports symptoms are worsening last week.  PMH significant for migraine and DDD of cervical spine  Past Medical History:  Diagnosis Date   COVID 07/30/2020   no vaccine   Degenerative disc disease, cervical    Depression    Migraine     There are no problems to display for this patient.   Past Surgical History:  Procedure Laterality Date   MINOR HEMORRHOIDECTOMY     OOPHORECTOMY     R ovary d/t ectopic pregnancy   TUBAL LIGATION     tubal reversal      OB History   No obstetric history on file.      Home Medications    Prior to Admission medications   Medication Sig Start Date End Date Taking? Authorizing Provider  amoxicillin-clavulanate (AUGMENTIN) 875-125 MG tablet Take 1 tablet by mouth 2 (two) times daily for 10 days. 03/26/21 04/05/21 Yes Trevor Iha, FNP  benzonatate (TESSALON) 200 MG capsule Take 1 capsule (200 mg total) by mouth 3 (three) times daily as needed for up to 7 days for cough. 03/26/21 04/02/21 Yes Trevor Iha, FNP  fexofenadine [facility] ALLERGY) 180 MG tablet Take 1 tablet (180 mg total) by mouth daily for 15 days. 03/26/21 04/10/21 Yes Trevor Iha, FNP  predniSONE (DELTASONE) 20 MG tablet Take 3 tabs PO daily x 5 days. 03/26/21  Yes Trevor Iha, FNP  albuterol (VENTOLIN HFA) 108 (90 Base) MCG/ACT inhaler Inhale 2 puffs into the lungs every 6 (six) hours as needed for wheezing or shortness of breath. 12/11/19   Lattie Haw, MD   Ascorbic Acid (VITAMIN C) 1000 MG tablet Take 1,000 mg by mouth daily.    [provider]  baclofen (LIORESAL) 10 MG tablet Take by mouth. 08/22/20 08/22/21  [provider]  Biotin 10 MG TABS Take 1 tablet by mouth daily.    [provider]  busPIRone (BUSPAR) 15 MG tablet Take 15 mg by mouth 3 (three) times daily. Patient not taking: No sig reported    [provider]  celecoxib (CELEBREX) 200 MG capsule Take 1 capsule by mouth 2 (two) times daily. 10/04/20   [provider]  Chlorphen-PE-Acetaminophen 4-10-325 MG TABS Take 1 tablet 4 times daily for sinus drainage/congestion and sinus headache. 07/30/20   [provider]  Cholecalciferol 25 MCG (1000 UT) tablet Take by mouth.    [provider]  DANDELION ROOT PO Take 3 tablets by mouth daily. Patient not taking: Reported on 11/05/2020    [provider]  drospirenone-ethinyl estradiol (YAZ) 3-0.02 MG tablet Take 1 tablet by mouth daily.    [provider]  DULoxetine (CYMBALTA) 60 MG capsule duloxetine 60 mg capsule,delayed release  TAKE 1 CAPSULE BY MOUTH ONCE DAILY 03/23/20   [provider]  escitalopram (LEXAPRO) 20 MG tablet Take 20 mg by mouth daily. Patient not taking: No sig reported    [provider]  fluticasone Aleda Grana)  50 MCG/ACT nasal spray Use 1 spray(s) in each nostril once daily 10/10/19   [provider]  Ginkgo Biloba 40 MG TABS Take 1 tablet by mouth daily.    [provider]  glucosamine-chondroitin 500-400 MG tablet Take 2 tablets by mouth daily. Patient not taking: Reported on 11/05/2020    [provider]  HYDROcodone-acetaminophen (NORCO) 10-325 MG tablet Take 1 tablet by mouth 3 (three) times daily as needed. 10/25/20   [provider]  hydrOXYzine (ATARAX/VISTARIL) 10 MG tablet Take 10 mg by mouth 2 (two) times daily. 10/24/20   [provider]  naloxone (NARCAN) nasal spray 4  mg/0.1 mL Narcan 4 mg/actuation nasal spray  ADMINISTER A SINGLE SPRAY IN ONE NOSTRIL UPON SIGNS OF OPIOID OVERDOSE. CALL 911. REPEAT AFTER 3 MINUTES IF NO RESPONSE.    [provider]  oxyCODONE-acetaminophen (PERCOCET/ROXICET) 5-325 MG tablet Take 1 tablet by mouth 4 (four) times daily as needed. 02/21/21   [provider]  promethazine (PHENERGAN) 25 MG tablet Take by mouth. 08/22/20   [provider]  rizatriptan (MAXALT) 10 MG tablet Take by mouth. 08/22/20   [provider]  sulfamethoxazole-trimethoprim (BACTRIM DS) 800-160 MG tablet Take 1 tablet by mouth 2 (two) times daily. Patient not taking: No sig reported 12/11/19   Lattie Haw, MD  tiZANidine (ZANAFLEX) 2 MG tablet Take 2 mg by mouth daily as needed for muscle spasms. Patient not taking: No sig reported    [provider]  topiramate (TOPAMAX) 25 MG tablet Take 1 po qhs x 2 weeks, then 2 po qhs x 2 weeks, then 3 po qhs. 08/22/20   [provider]  traMADol (ULTRAM) 50 MG tablet Take by mouth every 6 (six) hours as needed. Patient not taking: No sig reported    [provider]  traZODone (DESYREL) 100 MG tablet Take 100 mg by mouth at bedtime.    [provider]    Family History Family History  Problem Relation Age of Onset   Diabetes Mother    Hypertension Mother    Emphysema Father     Social History Social History   Tobacco Use   Smoking status: Former    Types: Cigarettes   Smokeless tobacco: Never  Vaping Use   Vaping Use: Every day  Substance Use Topics   Alcohol use: Not Currently   Drug use: Not Currently     Allergies   Patient has no known allergies.   Review of Systems Review of Systems  HENT:  Positive for congestion, sinus pressure, sinus pain and sore throat.   Respiratory:  Positive for cough.   All other systems reviewed and are negative.   Physical Exam Triage Vital Signs ED Triage Vitals  Enc Vitals Group     BP  03/26/21 1223 132/83     Pulse Rate 03/26/21 1223 85     Resp 03/26/21 1223 17     Temp 03/26/21 1223 98.4 F (36.9 C)     Temp Source 03/26/21 1223 Oral     SpO2 03/26/21 1223 97 %     Weight --      Height --      Head Circumference --      Peak Flow --      Pain Score 03/26/21 1226 0     Pain Loc --      Pain Edu? --      Excl. in GC? --    No data found.  Updated  Vital Signs BP 132/83 (BP Location: Right Arm)    Pulse 85    Temp 98.4 F (36.9 C) (Oral)    Resp 17    LMP 03/06/2021 (Approximate)    SpO2 97%      Physical Exam Vitals and nursing note reviewed.  Constitutional:      General: She is not in acute distress.    Appearance: Normal appearance. She is normal weight. She is not ill-appearing.  HENT:     Head: Normocephalic and atraumatic.     Right Ear: Tympanic membrane and external ear normal.     Left Ear: Tympanic membrane and external ear normal.     Ears:     Comments: Mild to moderate eustachian tube noted bilaterally    Nose:     Right Sinus: Maxillary sinus tenderness and frontal sinus tenderness present.     Left Sinus: Maxillary sinus tenderness and frontal sinus tenderness present.     Comments: Turbinates are erythematous/edematous    Mouth/Throat:     Mouth: Mucous membranes are moist.     Pharynx: Oropharynx is clear.     Comments: Moderate amount of clear drainage of posterior oropharynx noted Eyes:     Extraocular Movements: Extraocular movements intact.     Conjunctiva/sclera: Conjunctivae normal.     Pupils: Pupils are equal, round, and reactive to light.  Cardiovascular:     Rate and Rhythm: Normal rate and regular rhythm.     Pulses: Normal pulses.     Heart sounds: Normal heart sounds.  Pulmonary:     Effort: Pulmonary effort is normal.     Breath sounds: Normal breath sounds. No wheezing, rhonchi or rales.  Skin:    General: Skin is warm and dry.  Neurological:     General: No focal deficit present.     Mental Status: She is  alert and oriented to person, place, and time.     UC Treatments / Results  Labs (all labs ordered are listed, but only abnormal results are displayed) Labs Reviewed - No data to display  EKG   Radiology No results found.  Procedures Procedures (including critical care time)  Medications Ordered in UC Medications - No data to display  Initial Impression / Assessment and Plan / UC Course  I have reviewed the triage vital signs and the nursing notes.  Pertinent labs & imaging results that were available during my care of the patient were reviewed by me and considered in my medical decision making (see chart for details).     MDM: 1.  Acute maxillary sinusitis, recurrence not specified-next Augmentin; 2.  Sinus pressure-Rx'd Prednisone; 3.  Allergic rhinitis-Rx'd Allegra; 4.  Cough-Rx'd Tessalon Perles. Advised patient to take medication as directed with food to completion.  Advised patient to take prednisone and Allegra with first dose of Augmentin for the next 5 of 10 days.  Advised may use Allegra as needed afterwards for concurrent postnasal drainage/drip.  Advised may take Tessalon Perles daily or as needed for cough.  Encouraged patient to increase daily water intake while taking these medications.  Advised patient if symptoms worsen and/or unresolved please follow-up with ENT for further evaluation.  Patient discharged home, hemodynamically stable. Final Clinical Impressions(s) / UC Diagnoses   Final diagnoses:  Acute maxillary sinusitis, recurrence not specified  Sinus pressure  Allergic rhinitis, unspecified seasonality, unspecified trigger  Cough, unspecified type     Discharge Instructions      Advised patient to take medication as directed  with food to completion.  Advised patient to take prednisone and Allegra with first dose of Augmentin for the next 5 of 10 days.  Advised may use Allegra as needed afterwards for concurrent postnasal drainage/drip.  Advised may  take Tessalon Perles daily or as needed for cough.  Encouraged patient to increase daily water intake while taking these medications.  Advised patient if symptoms worsen and/or unresolved please follow-up with ENT for further evaluation.     ED Prescriptions     Medication Sig Dispense Auth. Provider   amoxicillin-clavulanate (AUGMENTIN) 875-125 MG tablet Take 1 tablet by mouth 2 (two) times daily for 10 days. 20 tablet Trevor Iha, FNP   predniSONE (DELTASONE) 20 MG tablet Take 3 tabs PO daily x 5 days. 15 tablet Trevor Iha, FNP   fexofenadine [facility] ALLERGY) 180 MG tablet Take 1 tablet (180 mg total) by mouth daily for 15 days. 15 tablet Trevor Iha, FNP   benzonatate (TESSALON) 200 MG capsule Take 1 capsule (200 mg total) by mouth 3 (three) times daily as needed for up to 7 days for cough. 40 capsule Trevor Iha, FNP      PDMP not reviewed this encounter.   Trevor Iha, FNP 03/26/21 1430

## 2021-06-24 ENCOUNTER — Other Ambulatory Visit: Payer: Self-pay | Admitting: Urology

## 2021-06-24 DIAGNOSIS — R319 Hematuria, unspecified: Secondary | ICD-10-CM

## 2021-06-26 ENCOUNTER — Other Ambulatory Visit: Payer: 59

## 2021-06-27 ENCOUNTER — Other Ambulatory Visit: Payer: 59

## 2021-06-28 ENCOUNTER — Other Ambulatory Visit: Payer: 59

## 2021-07-02 ENCOUNTER — Ambulatory Visit (INDEPENDENT_AMBULATORY_CARE_PROVIDER_SITE_OTHER): Payer: 59

## 2021-07-02 DIAGNOSIS — R109 Unspecified abdominal pain: Secondary | ICD-10-CM | POA: Diagnosis not present

## 2021-07-02 DIAGNOSIS — R102 Pelvic and perineal pain: Secondary | ICD-10-CM

## 2021-07-02 DIAGNOSIS — R319 Hematuria, unspecified: Secondary | ICD-10-CM | POA: Diagnosis not present

## 2021-10-23 ENCOUNTER — Telehealth: Payer: Self-pay | Admitting: Emergency Medicine

## 2021-10-23 ENCOUNTER — Ambulatory Visit: Payer: 59

## 2022-12-01 ENCOUNTER — Ambulatory Visit
Admission: RE | Admit: 2022-12-01 | Discharge: 2022-12-01 | Disposition: A | Discharge status: Home / Self Care | Payer: Managed Care, Other (non HMO) | Source: Ambulatory Visit | Attending: Family Medicine | Admitting: Family Medicine

## 2022-12-01 ENCOUNTER — Telehealth: Payer: Self-pay | Admitting: Family Medicine

## 2022-12-01 ENCOUNTER — Ambulatory Visit: Payer: Managed Care, Other (non HMO)

## 2022-12-01 VITALS — BP 113/80 | HR 82 | Temp 99.2°F | Resp 16

## 2022-12-01 DIAGNOSIS — R0689 Other abnormalities of breathing: Secondary | ICD-10-CM

## 2022-12-01 DIAGNOSIS — R059 Cough, unspecified: Secondary | ICD-10-CM

## 2022-12-01 MED ORDER — PREDNISONE 10 MG (21) PO TBPK: ORAL_TABLET | Freq: Every day | ORAL | 0 refills | Status: DC

## 2022-12-01 MED ORDER — HYDROCODONE BIT-HOMATROP MBR 5-1.5 MG/5ML PO SOLN: 5.0000 mL | Freq: Four times a day (QID) | ORAL | 0 refills | Status: DC | PRN

## 2022-12-01 MED ORDER — BENZONATATE 200 MG PO CAPS: 200.0000 mg | ORAL_CAPSULE | Freq: Three times a day (TID) | ORAL | 0 refills | Status: AC | PRN

## 2022-12-01 MED ORDER — DOXYCYCLINE HYCLATE 100 MG PO CAPS: 100.0000 mg | ORAL_CAPSULE | Freq: Two times a day (BID) | ORAL | 0 refills | Status: AC

## 2022-12-01 MED ORDER — PROMETHAZINE-DM 6.25-15 MG/5ML PO SYRP: 5.0000 mL | ORAL_SOLUTION | Freq: Two times a day (BID) | ORAL | 0 refills | Status: DC | PRN

## 2022-12-01 NOTE — ED Triage Notes (Signed)
Pt presents to uc with co of chest congestion, cough, ear pressure since 1.5 weeks ago. Pt reports she was treated for bronchitis and just finished that yesterday with mild improvement but symptoms continue.

## 2022-12-01 NOTE — ED Provider Notes (Signed)
[facility]    CSN: [id number] Arrival date & time: 12/01/22  1418      History   Chief Complaint Chief Complaint  Patient presents with   chest congestion     HPI Julie Morris is a 47 y.o. female.   HPI 47 year old female presents with chest congestion cough, and ear pressure for 1.5 weeks.  Reports that she is treated for bronchitis and just finished treatment yesterday.  PMH significant for depression and migraine.  Past Medical History:  Diagnosis Date   COVID 07/30/2020   no vaccine   Degenerative disc disease, cervical    Depression    Migraine     There are no problems to display for this patient.   Past Surgical History:  Procedure Laterality Date   MINOR HEMORRHOIDECTOMY     OOPHORECTOMY     R ovary d/t ectopic pregnancy   TUBAL LIGATION     tubal reversal      OB History   No obstetric history on file.      Home Medications    Prior to Admission medications   Medication Sig Start Date End Date Taking? Authorizing Provider  benzonatate (TESSALON) 200 MG capsule Take 1 capsule (200 mg total) by mouth 3 (three) times daily as needed for up to 7 days. 12/01/22 12/08/22 Yes Trevor Iha, FNP  HYDROcodone bit-homatropine (HYCODAN) 5-1.5 MG/5ML syrup Take 5 mLs by mouth every 6 (six) hours as needed for cough. 12/01/22  Yes Trevor Iha, FNP  predniSONE (STERAPRED UNI-PAK 21 TAB) 10 MG (21) TBPK tablet Take by mouth daily. Take 6 tabs by mouth daily  for 2 days, then 5 tabs for 2 days, then 4 tabs for 2 days, then 3 tabs for 2 days, 2 tabs for 2 days, then 1 tab by mouth daily for 2 days 12/01/22  Yes Trevor Iha, FNP  promethazine-dextromethorphan (PROMETHAZINE-DM) 6.25-15 MG/5ML syrup Take 5 mLs by mouth 2 (two) times daily as needed for cough. 12/01/22  Yes Trevor Iha, FNP  albuterol (VENTOLIN HFA) 108 (90 Base) MCG/ACT inhaler Inhale 2 puffs into the lungs every 6 (six) hours as needed for wheezing or shortness of breath. 12/11/19    Lattie Haw, MD  Ascorbic Acid (VITAMIN C) 1000 MG tablet Take 1,000 mg by mouth daily.    [provider]  Biotin 10 MG TABS Take 1 tablet by mouth daily.    [provider]  busPIRone (BUSPAR) 15 MG tablet Take 15 mg by mouth 3 (three) times daily. Patient not taking: No sig reported    [provider]  celecoxib (CELEBREX) 200 MG capsule Take 1 capsule by mouth 2 (two) times daily. 10/04/20   [provider]  Chlorphen-PE-Acetaminophen 4-10-325 MG TABS Take 1 tablet 4 times daily for sinus drainage/congestion and sinus headache. 07/30/20   [provider]  Cholecalciferol 25 MCG (1000 UT) tablet Take by mouth.    [provider]  DANDELION ROOT PO Take 3 tablets by mouth daily. Patient not taking: Reported on 11/05/2020    [provider]  doxycycline (VIBRAMYCIN) 100 MG capsule Take 1 capsule (100 mg total) by mouth 2 (two) times daily for 7 days. 12/01/22 12/08/22  Trevor Iha, FNP  drospirenone-ethinyl estradiol (YAZ) 3-0.02 MG tablet Take 1 tablet by mouth daily.    [provider]  DULoxetine (CYMBALTA) 60 MG capsule duloxetine 60 mg capsule,delayed release  TAKE 1 CAPSULE BY MOUTH ONCE DAILY 03/23/20   [provider]  escitalopram (  LEXAPRO) 20 MG tablet Take 20 mg by mouth daily. Patient not taking: No sig reported    [provider]  fexofenadine (ALLEGRA ALLERGY) 180 MG tablet Take 1 tablet (180 mg total) by mouth daily for 15 days. 03/26/21 04/10/21  Trevor Iha, FNP  fluticasone (FLONASE) 50 MCG/ACT nasal spray Use 1 spray(s) in each nostril once daily 10/10/19   [provider]  Ginkgo Biloba 40 MG TABS Take 1 tablet by mouth daily.    [provider]  glucosamine-chondroitin 500-400 MG tablet Take 2 tablets by mouth daily. Patient not taking: Reported on 11/05/2020    [provider]  hydrOXYzine (ATARAX/VISTARIL) 10 MG tablet Take 10 mg by mouth 2 (two) times  daily. 10/24/20   [provider]  naloxone (NARCAN) nasal spray 4 mg/0.1 mL Narcan 4 mg/actuation nasal spray  ADMINISTER A SINGLE SPRAY IN ONE NOSTRIL UPON SIGNS OF OPIOID OVERDOSE. CALL 911. REPEAT AFTER 3 MINUTES IF NO RESPONSE.    [provider]  oxyCODONE-acetaminophen (PERCOCET/ROXICET) 5-325 MG tablet Take 1 tablet by mouth 4 (four) times daily as needed. 02/21/21   [provider]  rizatriptan (MAXALT) 10 MG tablet Take by mouth. 08/22/20   [provider]  topiramate (TOPAMAX) 25 MG tablet Take 1 po qhs x 2 weeks, then 2 po qhs x 2 weeks, then 3 po qhs. 08/22/20   [provider]  traZODone (DESYREL) 100 MG tablet Take 100 mg by mouth at bedtime.    [provider]    Family History Family History  Problem Relation Age of Onset   Diabetes Mother    Hypertension Mother    Emphysema Father     Social History Social History   Tobacco Use   Smoking status: Former    Types: Cigarettes   Smokeless tobacco: Never  Vaping Use   Vaping status: Every Day  Substance Use Topics   Alcohol use: Not Currently   Drug use: Not Currently     Allergies   Patient has no known allergies.   Review of Systems Review of Systems  HENT:  Positive for congestion and ear pain.   Respiratory:  Positive for cough.   All other systems reviewed and are negative.    Physical Exam Triage Vital Signs ED Triage Vitals  Encounter Vitals Group     BP 12/01/22 1458 113/80     Systolic BP Percentile --      Diastolic BP Percentile --      Pulse Rate 12/01/22 1458 82     Resp 12/01/22 1458 16     Temp 12/01/22 1458 99.2 F (37.3 C)     Temp Source 12/01/22 1458 Oral     SpO2 12/01/22 1458 98 %     Weight --      Height --      Head Circumference --      Peak Flow --      Pain Score 12/01/22 1457 4     Pain Loc --      Pain Education --      Exclude from Growth Chart --    No data found.  Updated Vital Signs BP 113/80   Pulse 82    Temp 99.2 F (37.3 C) (Oral)   Resp 16   SpO2 98%    Physical Exam Vitals and nursing note reviewed.  Constitutional:      Appearance: Normal appearance. She is normal weight.  HENT:     Head: Normocephalic and  atraumatic.     Right Ear: Tympanic membrane, ear canal and external ear normal.     Left Ear: Tympanic membrane, ear canal and external ear normal.     Mouth/Throat:     Mouth: Mucous membranes are moist.     Pharynx: Oropharynx is clear.  Eyes:     Extraocular Movements: Extraocular movements intact.     Conjunctiva/sclera: Conjunctivae normal.     Pupils: Pupils are equal, round, and reactive to light.  Cardiovascular:     Rate and Rhythm: Normal rate and regular rhythm.     Pulses: Normal pulses.     Heart sounds: Normal heart sounds.  Pulmonary:     Effort: Pulmonary effort is normal.     Breath sounds: Wheezing, rhonchi and rales present.     Comments: Diffuse  scattered rhonchi throughout, fine rales, wheezing over right middle/lower, infrequent nonproductive cough on exam Musculoskeletal:        General: Normal range of motion.     Cervical back: Normal range of motion and neck supple.  Skin:    General: Skin is warm and dry.  Neurological:     General: No focal deficit present.     Mental Status: She is alert and oriented to person, place, and time. Mental status is at baseline.  Psychiatric:        Mood and Affect: Mood normal.        Behavior: Behavior normal.      UC Treatments / Results  Labs (all labs ordered are listed, but only abnormal results are displayed) Labs Reviewed - No data to display  EKG   Radiology DG Chest 2 View  Result Date: 12/01/2022 CLINICAL DATA:  Cough EXAM: CHEST - 2 VIEW COMPARISON:  None Available. FINDINGS: The heart size and mediastinal contours are within normal limits. Both lungs are clear. The visualized skeletal structures are unremarkable. IMPRESSION: No active cardiopulmonary disease. Electronically Signed    By: Jasmine Pang M.D.   On: 12/01/2022 18:07    Procedures Procedures (including critical care time)  Medications Ordered in UC Medications - No data to display  Initial Impression / Assessment and Plan / UC Course  I have reviewed the triage vital signs and the nursing notes.  Pertinent labs & imaging results that were available during my care of the patient were reviewed by me and considered in my medical decision making (see chart for details).     MDM: 1.  Cough, unspecified type-CXR results revealed above, Rx'd Sterapred Unipak (tapering from 60 mg to 10 mg over 10 days, Rx'd Hycodan 5-1.5 mg / 5 mL syrup: Take 5 mL every 6 hours as needed for cough, Rx'd Tessalon 200 mg capsules: Take 1 capsule 3 times daily, as needed; 2.  Adventitious breath sounds-CXR results revealed above, Rx'd doxycycline 100 mg capsule: Take 1 capsule twice daily x 7 days.  Patient discharged home, hemodynamically stable. Final Clinical Impressions(s) / UC Diagnoses   Final diagnoses:  Cough, unspecified type  Adventitious breath sounds     Discharge Instructions      Advised patient to take medications as directed with food to completion.  Advised may take Tessalon daily or as needed for cough.  Advised may take Hycodan at night for cough due to sedative effects.  Encouraged to increase daily water intake to 64 ounces per day while taking these medications.  Advised we will follow-up with chest x-ray results once received.  Advised if symptoms worsen and/or unresolved please follow-up  PCP or here for further evaluation.     ED Prescriptions     Medication Sig Dispense Auth. Provider   predniSONE (STERAPRED UNI-PAK 21 TAB) 10 MG (21) TBPK tablet Take by mouth daily. Take 6 tabs by mouth daily  for 2 days, then 5 tabs for 2 days, then 4 tabs for 2 days, then 3 tabs for 2 days, 2 tabs for 2 days, then 1 tab by mouth daily for 2 days 42 tablet Trevor Iha, FNP   benzonatate (TESSALON) 200 MG capsule  Take 1 capsule (200 mg total) by mouth 3 (three) times daily as needed for up to 7 days. 40 capsule Trevor Iha, FNP   HYDROcodone bit-homatropine (HYCODAN) 5-1.5 MG/5ML syrup Take 5 mLs by mouth every 6 (six) hours as needed for cough. 120 mL Trevor Iha, FNP   promethazine-dextromethorphan (PROMETHAZINE-DM) 6.25-15 MG/5ML syrup Take 5 mLs by mouth 2 (two) times daily as needed for cough. 118 mL Trevor Iha, FNP      I have reviewed the PDMP during this encounter.   Trevor Iha, FNP 12/01/22 1946

## 2022-12-01 NOTE — Telephone Encounter (Signed)
Doxycycline sent to patient's pharmacy.

## 2022-12-01 NOTE — Discharge Instructions (Addendum)
Advised patient to take medications as directed with food to completion.  Advised may take Tessalon daily or as needed for cough.  Advised may take Hycodan at night for cough due to sedative effects.  Encouraged to increase daily water intake to 64 ounces per day while taking these medications.  Advised we will follow-up with chest x-ray results once received.  Advised if symptoms worsen and/or unresolved please follow-up PCP or here for further evaluation.

## 2023-01-22 IMAGING — CT CT RENAL STONE PROTOCOL
2 of 4 series · 15 of 46 positions shown, 17 images · non-contrast
Comparison: None Available.

CLINICAL DATA: Pelvic and flank pain.  Hematuria.



[Series 2: axial st · axial · 0.64mm/px · z∈[-466,-130]mm · 12 of 81 slices shown, 14 images]
[im 7/81  soft-tissue]
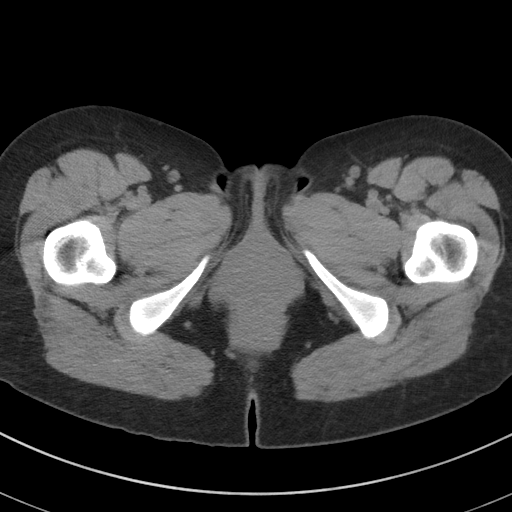
[im 7/81  bone]
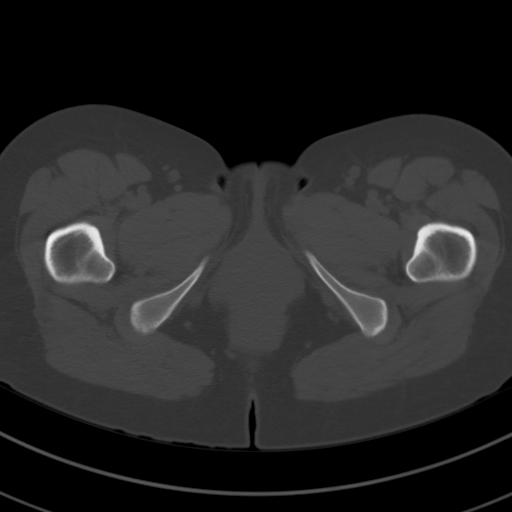
[im 13/81  soft-tissue]
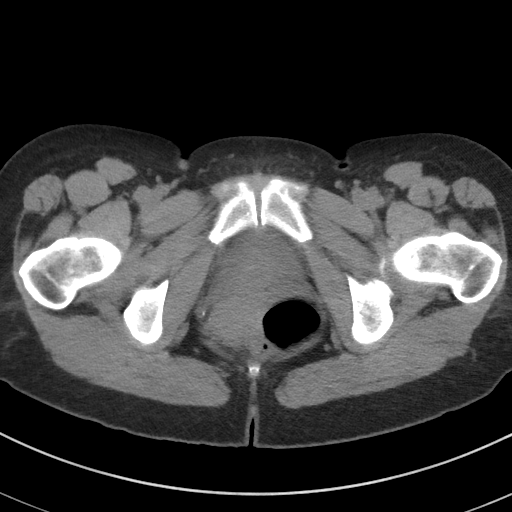
[im 19/81  soft-tissue]
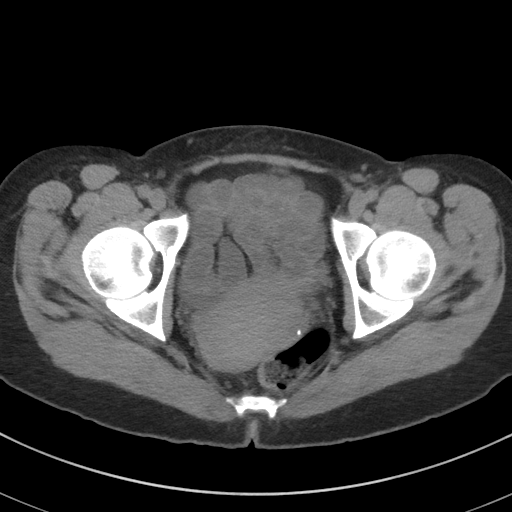
[im 25/81  soft-tissue]
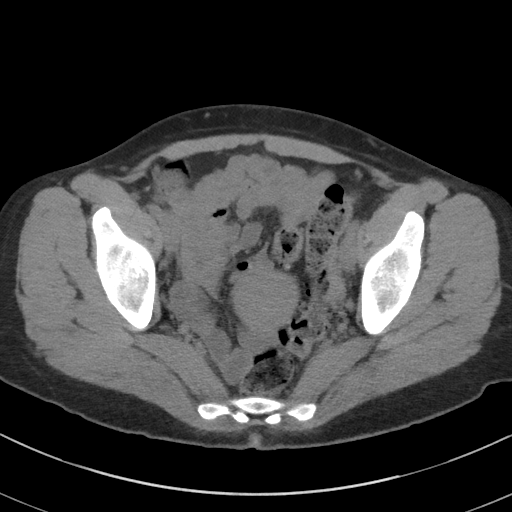
[im 31/81  soft-tissue]
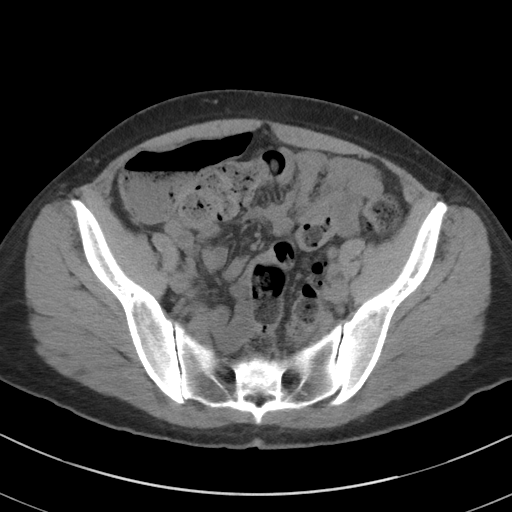
[im 37/81  soft-tissue]
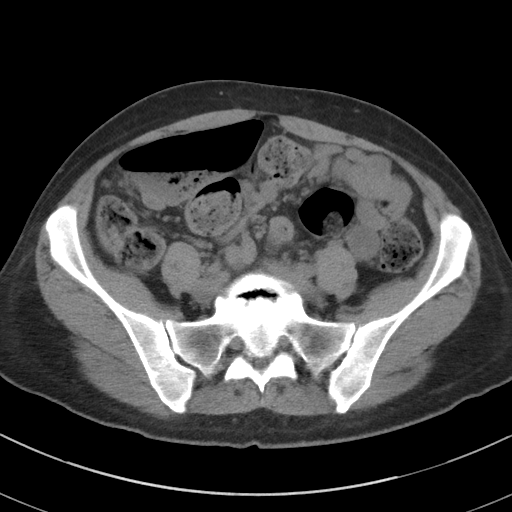
[im 44/81  soft-tissue]
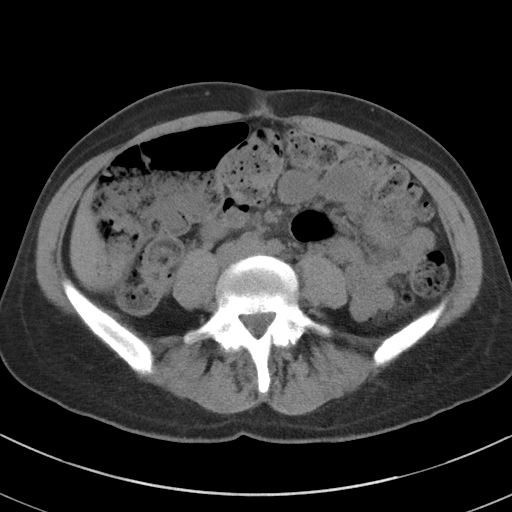
[im 50/81  soft-tissue]
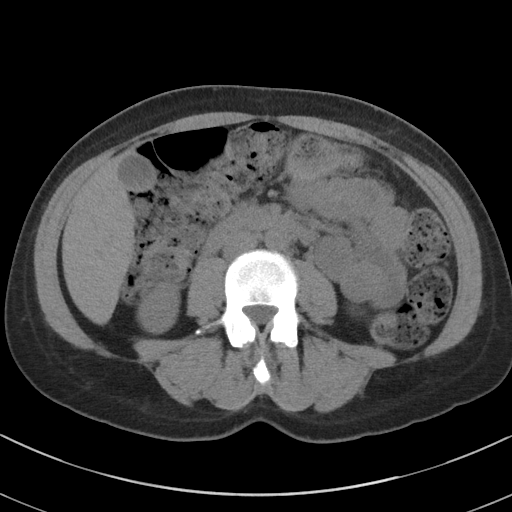
[im 56/81  soft-tissue]
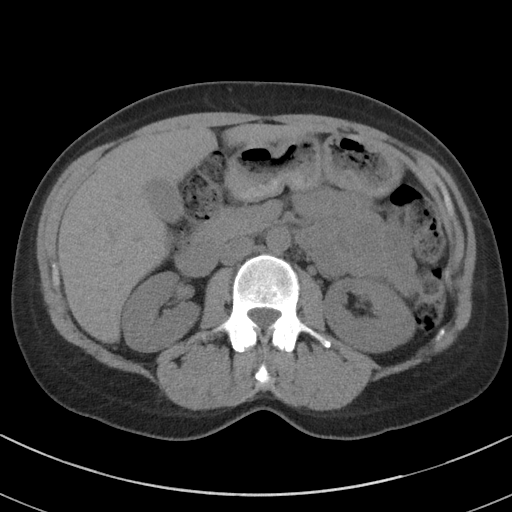
[im 56/81  bone]
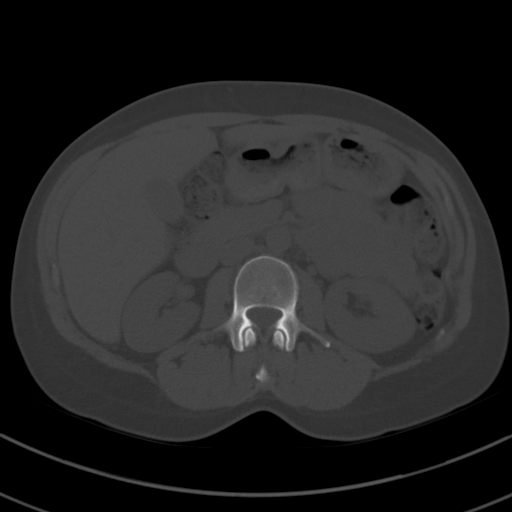
[im 62/81  soft-tissue]
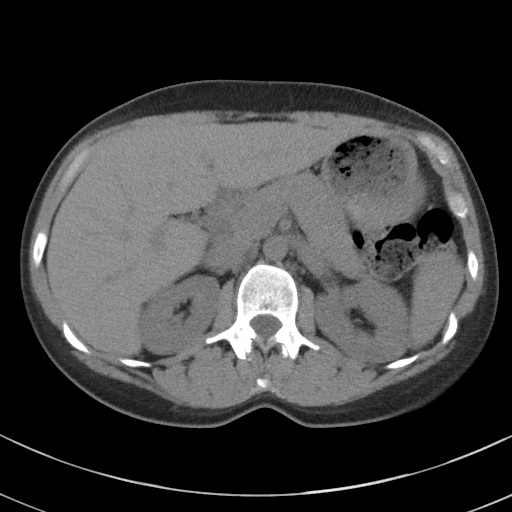
[im 68/81  soft-tissue]
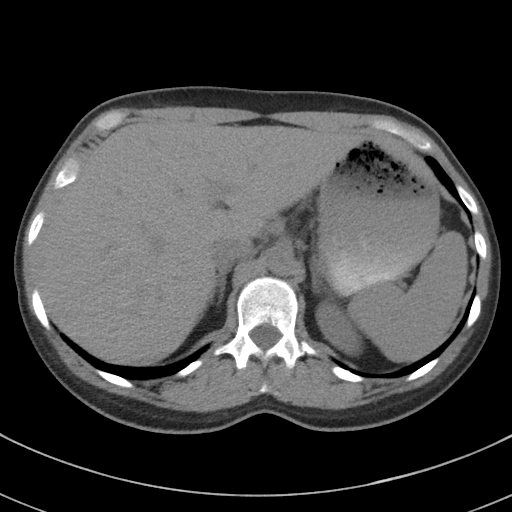
[im 74/81  soft-tissue]
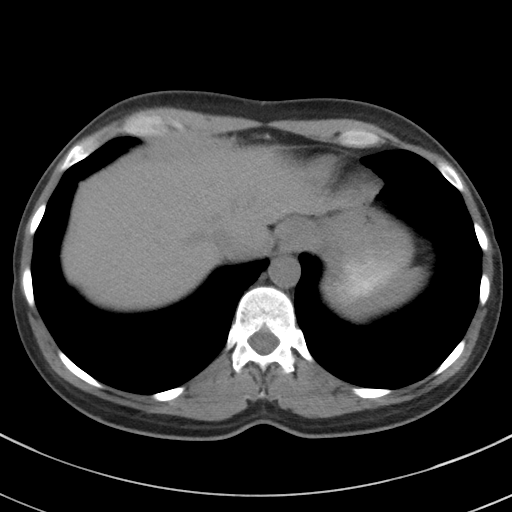

[Series 4: coronal st · coronal · 0.60mm/px · 3 of 73 slices shown]
[im 25/73  soft-tissue]
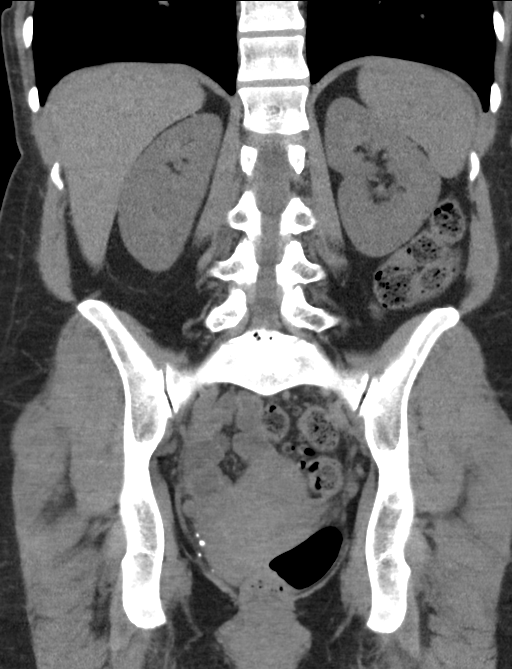
[im 33/73  soft-tissue]
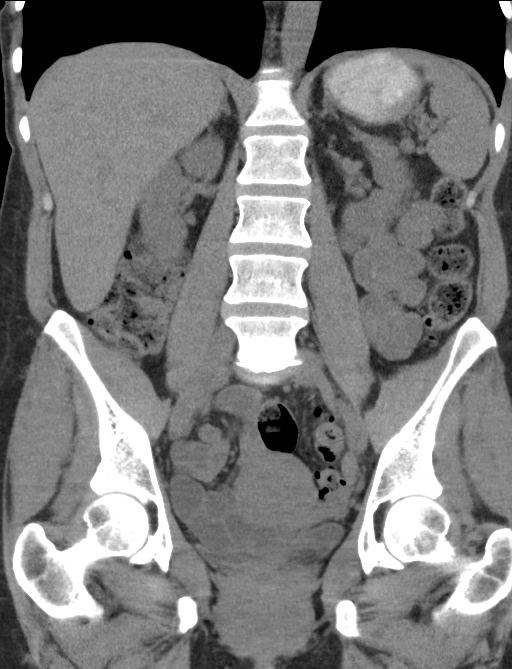
[im 41/73  soft-tissue]
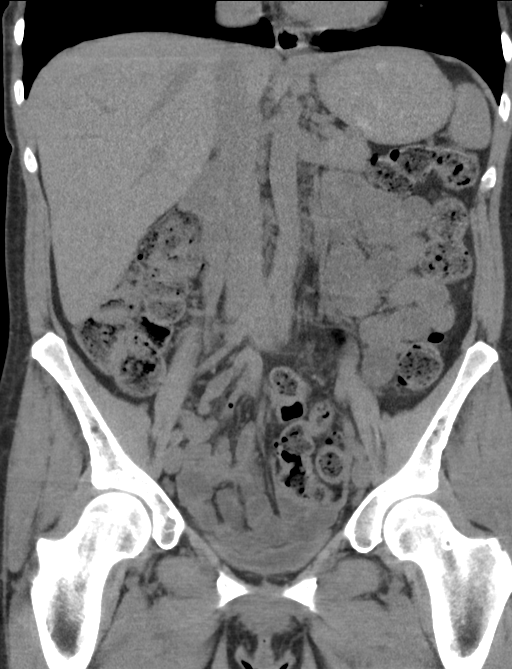

[15 of 46 positions shown; findings below may reference images not displayed]

FINDINGS: Lower chest: No acute airspace disease.  No pleural effusion.

Hepatobiliary: No focal liver abnormality on this unenhanced exam.
Possible layering hyperdensity in the gallbladder, may represent
stones or sludge. No pericholecystic inflammation. No biliary
dilatation.

Pancreas: No ductal dilatation or inflammation.

Spleen: Normal in size without focal abnormality.

Adrenals/Urinary Tract: Normal adrenal glands. No hydronephrosis. No
renal calculi. No perinephric edema. No contour deforming renal
masses. The urinary bladder is near completely empty. No bladder
stone or wall thickening.

Stomach/Bowel: Detailed bowel assessment is limited in the absence
of enteric contrast. Tiny hiatal hernia. Ingested material within
the stomach. There is no small bowel obstruction or inflammation.
Large volume of stool throughout the colon with colonic tortuosity.
Appendix is normal. No colonic inflammation. Occasional sigmoid
colonic diverticulosis without diverticulitis.

Vascular/Lymphatic: Abdominal aorta is normal in caliber. There is
no bulky abdominopelvic adenopathy.

Reproductive: Mild soft tissue prominence in the cervix. Left ovary
is visualized and quiescent. There is no adnexal mass. Prior right
oophorectomy.

Other: No free air or ascites.

Musculoskeletal: Degenerative disc disease at L5-S1 with disc space
narrowing, vacuum phenomenon and endplate sclerosis. There are no
acute or suspicious osseous abnormalities.
IMPRESSION: 1. No renal stones or obstructive uropathy. No explanation for
hematuria on this noncontrast exam.
2. Large volume of stool throughout the colon with colonic
tortuosity, suggesting constipation.
3. Possible layering hyperdensity in the gallbladder, may represent
stones or sludge. No pericholecystic inflammation.
4. Mild soft tissue prominence in the cervix, recommend correlation
with physical exam.

## 2023-02-04 ENCOUNTER — Encounter: Payer: Self-pay | Admitting: Family Medicine

## 2023-02-04 ENCOUNTER — Ambulatory Visit: Payer: No Typology Code available for payment source | Attending: Family Medicine

## 2023-02-04 ENCOUNTER — Ambulatory Visit (INDEPENDENT_AMBULATORY_CARE_PROVIDER_SITE_OTHER): Payer: No Typology Code available for payment source | Admitting: Family Medicine

## 2023-02-04 VITALS — BP 146/80 | HR 92 | Ht 67.0 in | Wt 164.5 lb

## 2023-02-04 DIAGNOSIS — F321 Major depressive disorder, single episode, moderate: Secondary | ICD-10-CM | POA: Diagnosis not present

## 2023-02-04 DIAGNOSIS — M62838 Other muscle spasm: Secondary | ICD-10-CM | POA: Insufficient documentation

## 2023-02-04 DIAGNOSIS — F411 Generalized anxiety disorder: Secondary | ICD-10-CM | POA: Diagnosis not present

## 2023-02-04 DIAGNOSIS — M797 Fibromyalgia: Secondary | ICD-10-CM | POA: Diagnosis not present

## 2023-02-04 DIAGNOSIS — I471 Supraventricular tachycardia, unspecified: Secondary | ICD-10-CM | POA: Insufficient documentation

## 2023-02-04 MED ORDER — METHYLPREDNISOLONE 4 MG PO TBPK: ORAL_TABLET | ORAL | 0 refills | Status: DC

## 2023-02-04 MED ORDER — METHOCARBAMOL 500 MG PO TABS: 500.0000 mg | ORAL_TABLET | Freq: Three times a day (TID) | ORAL | 0 refills | Status: AC

## 2023-02-04 MED ORDER — CITALOPRAM HYDROBROMIDE 10 MG PO TABS: 10.0000 mg | ORAL_TABLET | Freq: Every day | ORAL | 3 refills | Status: AC

## 2023-02-04 NOTE — Assessment & Plan Note (Signed)
 Will go ahead and restart celexa  - have also sent referral into therapy to see if patient can talk to someone about everything that has been going on with her  - denies homicidal and suicidal ideation

## 2023-02-04 NOTE — Assessment & Plan Note (Signed)
 Pt currently on duloxetine 60mg  and feels like it is working well. For the past few weeks patient has not been feeling well and has had myalgias. Have gone ahead and sent in medrol  dose pack to see if we can knock down inflammation.

## 2023-02-04 NOTE — Progress Notes (Unsigned)
 EP to read.

## 2023-02-04 NOTE — Assessment & Plan Note (Signed)
 Have sent new referral to cardiology since patient has had mixed information about her SVT. Have also sent in zio patch and tsh order to see if there is another cause for SVT and zio patch will allow us  to check if there are any types of arrhythmias going on.

## 2023-02-04 NOTE — Assessment & Plan Note (Signed)
-   Refill celexa

## 2023-02-04 NOTE — Assessment & Plan Note (Signed)
 Believe the side pain patient is feeling is related to a bad muscle spasm. Will go ahead and give robaxin  and recommended salon pas lidocaine patch to help

## 2023-02-04 NOTE — Progress Notes (Signed)
 Established patient visit   Patient: Julie Morris   DOB: 03-07-1975   48 y.o. Female  MRN: [id number] Visit Date: 02/04/2023  Today's healthcare provider: Josepha Nickels, DO   Chief Complaint  Patient presents with   Breast Pain    Establish Care   New Patient (Initial Visit)    SUBJECTIVE    Chief Complaint  Patient presents with   Breast Pain    Establish Care   New Patient (Initial Visit)   HPI HPI     Breast Pain    Additional comments: Establish Care      Last edited by Denece Finger, CMA on 02/04/2023  2:51 PM.      Pt presents to establish care.   Hx SVT - pt has strong family hx of cardiac disease with mother who had 9 stents   Is on Yaz not for contraception purposes but secondary to a ruptured ectopic pregnancy and had her tubes removed.   Has undiagnosed fibromyalgia and has been feeling bad recently   Has a hx of degenerative disc disease and follows with Lonny Robertson at [facility]   Had a brother who committed suicide   Has a hx of MDD and GAD and was on celexa  and buspar at one time. Currently taking hydroxyzine but unsure if this works.   Has some left sided back pain.   Review of Systems  Constitutional:  Negative for activity change, fatigue and fever.  Respiratory:  Negative for cough and shortness of breath.   Cardiovascular:  Negative for chest pain.  Gastrointestinal:  Negative for abdominal pain.  Genitourinary:  Negative for difficulty urinating.  Musculoskeletal:        Left sided back pain tender to palpation of latissimus dorsi       Current Meds  Medication Sig   albuterol  (VENTOLIN  HFA) 108 (90 Base) MCG/ACT inhaler Inhale 2 puffs into the lungs every 6 (six) hours as needed for wheezing or shortness of breath.   Ascorbic Acid (VITAMIN C) 1000 MG tablet Take 1,000 mg by mouth daily.   Biotin 10 MG TABS Take 1 tablet by mouth daily.   celecoxib (CELEBREX) 200 MG capsule Take 1 capsule by mouth 2 (two) times daily.    Cholecalciferol 25 MCG (1000 UT) tablet Take by mouth.   citalopram  (CELEXA ) 10 MG tablet Take 1 tablet (10 mg total) by mouth daily.   drospirenone-ethinyl estradiol (YAZ) 3-0.02 MG tablet Take 1 tablet by mouth daily.   DULoxetine (CYMBALTA) 60 MG capsule duloxetine 60 mg capsule,delayed release  TAKE 1 CAPSULE BY MOUTH ONCE DAILY   fluticasone (FLONASE) 50 MCG/ACT nasal spray Use 1 spray(s) in each nostril once daily   Ginkgo Biloba 40 MG TABS Take 1 tablet by mouth daily.   hydrOXYzine (ATARAX/VISTARIL) 10 MG tablet Take 10 mg by mouth 2 (two) times daily.   methocarbamol  (ROBAXIN ) 500 MG tablet Take 1 tablet (500 mg total) by mouth 3 (three) times daily.   methylPREDNISolone  (MEDROL  DOSEPAK) 4 MG TBPK tablet Follow instructions on pill pack   rizatriptan (MAXALT) 10 MG tablet Take by mouth.   topiramate (TOPAMAX) 25 MG tablet Take 1 po qhs x 2 weeks, then 2 po qhs x 2 weeks, then 3 po qhs.   traZODone (DESYREL) 100 MG tablet Take 100 mg by mouth at bedtime.    OBJECTIVE    BP (!) 146/80 (BP Location: Left Arm, Patient Position: Sitting, Cuff Size: Large)   Pulse 92  Ht 5\' 7"  (1.702 m)   Wt 164 lb 8 oz (74.6 kg)   SpO2 100%   BMI 25.76 kg/m   Physical Exam Vitals and nursing note reviewed.  Constitutional:      General: She is not in acute distress.    Appearance: Normal appearance.  HENT:     Head: Normocephalic and atraumatic.     Right Ear: External ear normal.     Left Ear: External ear normal.     Nose: Nose normal.  Eyes:     Conjunctiva/sclera: Conjunctivae normal.  Cardiovascular:     Rate and Rhythm: Normal rate and regular rhythm.  Pulmonary:     Effort: Pulmonary effort is normal.     Breath sounds: Normal breath sounds.  Neurological:     General: No focal deficit present.     Mental Status: She is alert and oriented to person, place, and time.  Psychiatric:        Mood and Affect: Mood normal.        Behavior: Behavior normal.        Thought Content:  Thought content normal.        Judgment: Judgment normal.        ASSESSMENT & PLAN    Problem List Items Addressed This Visit       Cardiovascular and Mediastinum   SVT (supraventricular tachycardia) (HCC)   Have sent new referral to cardiology since patient has had mixed information about her SVT. Have also sent in zio patch and tsh order to see if there is another cause for SVT and zio patch will allow us  to check if there are any types of arrhythmias going on.      Relevant Orders   TSH + free T4   Basic Metabolic Panel (BMET)   Ambulatory referral to Cardiology   LONG TERM MONITOR (3-14 DAYS)     Other   Fibromyalgia - Primary   Pt currently on duloxetine 60mg  and feels like it is working well. For the past few weeks patient has not been feeling well and has had myalgias. Have gone ahead and sent in medrol  dose pack to see if we can knock down inflammation.       Relevant Medications   citalopram  (CELEXA ) 10 MG tablet   methylPREDNISolone  (MEDROL  DOSEPAK) 4 MG TBPK tablet   methocarbamol  (ROBAXIN ) 500 MG tablet   Muscle spasm   Believe the side pain patient is feeling is related to a bad muscle spasm. Will go ahead and give robaxin  and recommended salon pas lidocaine patch to help       Relevant Medications   methocarbamol  (ROBAXIN ) 500 MG tablet   Current moderate episode of major depressive disorder without prior episode (HCC)   Will go ahead and restart celexa  - have also sent referral into therapy to see if patient can talk to someone about everything that has been going on with her  - denies homicidal and suicidal ideation      Relevant Medications   citalopram  (CELEXA ) 10 MG tablet   Other Relevant Orders   Basic Metabolic Panel (BMET)   Ambulatory referral to Behavioral Health   GAD (generalized anxiety disorder)   Refill celexa        Relevant Medications   citalopram  (CELEXA ) 10 MG tablet   Other Relevant Orders   Basic Metabolic Panel (BMET)    Ambulatory referral to Behavioral Health    Return in about 4 weeks (around 03/04/2023).  Meds ordered this encounter  Medications   citalopram  (CELEXA ) 10 MG tablet    Sig: Take 1 tablet (10 mg total) by mouth daily.    Dispense:  30 tablet    Refill:  3   methylPREDNISolone  (MEDROL  DOSEPAK) 4 MG TBPK tablet    Sig: Follow instructions on pill pack    Dispense:  21 tablet    Refill:  0   methocarbamol  (ROBAXIN ) 500 MG tablet    Sig: Take 1 tablet (500 mg total) by mouth 3 (three) times daily.    Dispense:  90 tablet    Refill:  0    Orders Placed This Encounter  Procedures   TSH + free T4   Basic Metabolic Panel (BMET)   Ambulatory referral to Cardiology    Referral Priority:   Routine    Referral Type:   Consultation    Referral Reason:   Specialty Services Required    Number of Visits Requested:   1   Ambulatory referral to Behavioral Health    Referral Priority:   Routine    Referral Type:   Psychiatric    Referral Reason:   Specialty Services Required    Requested Specialty:   Behavioral Health    Number of Visits Requested:   1   LONG TERM MONITOR (3-14 DAYS)    Standing Status:   Future    Number of Occurrences:   1    Expiration Date:   02/04/2024    Where should this test be performed?:   [facility] ([facility])    Does the patient have an implanted cardiac device?:   No    Prescribed days of wear:   7    Type of enrollment:   Home Enrollment    Vendor::   [employer]     Dash Cardarelli S Adabella Stanis, DO  [facility] at [facility] [phone] (phone) [phone] (fax)  [facility]

## 2023-02-05 ENCOUNTER — Ambulatory Visit: Payer: No Typology Code available for payment source | Admitting: Family Medicine

## 2023-02-05 ENCOUNTER — Encounter: Payer: Self-pay | Admitting: Family Medicine

## 2023-02-05 VITALS — BP 152/135 | HR 88 | Temp 99.5°F | Resp 18

## 2023-02-05 DIAGNOSIS — R6889 Other general symptoms and signs: Secondary | ICD-10-CM

## 2023-02-05 DIAGNOSIS — J329 Chronic sinusitis, unspecified: Secondary | ICD-10-CM

## 2023-02-05 DIAGNOSIS — J40 Bronchitis, not specified as acute or chronic: Secondary | ICD-10-CM | POA: Diagnosis not present

## 2023-02-05 LAB — BASIC METABOLIC PANEL
BUN/Creatinine Ratio: 14 (ref 9–23)
BUN: 11 mg/dL (ref 6–24)
CO2: 23 mmol/L (ref 20–29)
Calcium: 9.6 mg/dL (ref 8.7–10.2)
Chloride: 100 mmol/L (ref 96–106)
Creatinine, Ser: 0.78 mg/dL (ref 0.57–1.00)
Glucose: 107 mg/dL — ABNORMAL HIGH (ref 70–99)
Potassium: 4.1 mmol/L (ref 3.5–5.2)
Sodium: 136 mmol/L (ref 134–144)
eGFR: 94 mL/min/{1.73_m2} (ref >59)

## 2023-02-05 LAB — TSH+FREE T4
Free T4: 0.81 ng/dL — ABNORMAL LOW (ref 0.82–1.77)
TSH: 3.07 u[IU]/mL (ref 0.450–4.500)

## 2023-02-05 LAB — POCT INFLUENZA A/B
Influenza A, POC: NEGATIVE
Influenza B, POC: NEGATIVE

## 2023-02-05 LAB — POC COVID19 BINAXNOW: SARS Coronavirus 2 Ag: NEGATIVE

## 2023-02-05 MED ORDER — AMOXICILLIN-POT CLAVULANATE 875-125 MG PO TABS
1.0000 | ORAL_TABLET | Freq: Two times a day (BID) | ORAL | 0 refills | Status: AC
Start: 2023-02-05 — End: 2023-02-10

## 2023-02-05 NOTE — Telephone Encounter (Signed)
Patient scheduled.

## 2023-02-05 NOTE — Progress Notes (Signed)
Established patient visit   Patient: Julie Morris   DOB: [birth year]   48 y.o. Female  MRN: [id number] Visit Date: 02/05/2023  Today's healthcare provider: Charlton Amor, DO   Chief Complaint  Patient presents with   Cough    SUBJECTIVE    Chief Complaint  Patient presents with   Cough   Cough Pertinent negatives include no chest pain, fever or shortness of breath.    Pt presents with congestion.   Review of Systems  Constitutional:  Negative for activity change, fatigue and fever.  HENT:  Positive for congestion.   Respiratory:  Positive for cough. Negative for shortness of breath.   Cardiovascular:  Negative for chest pain.  Gastrointestinal:  Negative for abdominal pain.  Genitourinary:  Negative for difficulty urinating.       Current Meds  Medication Sig   albuterol (VENTOLIN HFA) 108 (90 Base) MCG/ACT inhaler Inhale 2 puffs into the lungs every 6 (six) hours as needed for wheezing or shortness of breath.   amoxicillin-clavulanate (AUGMENTIN) 875-125 MG tablet Take 1 tablet by mouth 2 (two) times daily for 5 days.   Ascorbic Acid (VITAMIN C) 1000 MG tablet Take 1,000 mg by mouth daily.   Biotin 10 MG TABS Take 1 tablet by mouth daily.   celecoxib (CELEBREX) 200 MG capsule Take 1 capsule by mouth 2 (two) times daily.   Cholecalciferol 25 MCG (1000 UT) tablet Take by mouth.   citalopram (CELEXA) 10 MG tablet Take 1 tablet (10 mg total) by mouth daily.   drospirenone-ethinyl estradiol (YAZ) 3-0.02 MG tablet Take 1 tablet by mouth daily.   DULoxetine (CYMBALTA) 60 MG capsule duloxetine 60 mg capsule,delayed release  TAKE 1 CAPSULE BY MOUTH ONCE DAILY   fluticasone (FLONASE) 50 MCG/ACT nasal spray Use 1 spray(s) in each nostril once daily   Ginkgo Biloba 40 MG TABS Take 1 tablet by mouth daily.   hydrOXYzine (ATARAX/VISTARIL) 10 MG tablet Take 10 mg by mouth 2 (two) times daily.   methocarbamol (ROBAXIN) 500 MG tablet Take 1 tablet (500 mg total) by mouth 3  (three) times daily.   methylPREDNISolone (MEDROL DOSEPAK) 4 MG TBPK tablet Follow instructions on pill pack   rizatriptan (MAXALT) 10 MG tablet Take by mouth.   topiramate (TOPAMAX) 25 MG tablet Take 1 po qhs x 2 weeks, then 2 po qhs x 2 weeks, then 3 po qhs.   traZODone (DESYREL) 100 MG tablet Take 100 mg by mouth at bedtime.    OBJECTIVE    BP (!) 152/135   Pulse 88   Temp 99.5 F (37.5 C) (Oral)   Resp 18   SpO2 96%   Physical Exam Vitals reviewed.  Constitutional:      Appearance: She is well-developed.  HENT:     Head: Normocephalic and atraumatic.  Eyes:     Conjunctiva/sclera: Conjunctivae normal.  Cardiovascular:     Rate and Rhythm: Normal rate.  Pulmonary:     Effort: Pulmonary effort is normal.  Skin:    General: Skin is dry.     Coloration: Skin is not pale.  Neurological:     Mental Status: She is alert and oriented to person, place, and time.  Psychiatric:        Behavior: Behavior normal.        ASSESSMENT & PLAN    Problem List Items Addressed This Visit       Respiratory   Sinobronchitis   Pt neg for covid and flu -  have gone ahead and sent in for augmentin as I do believe this is a sinobronchitis given congestion symptoms and cough      Relevant Medications   amoxicillin-clavulanate (AUGMENTIN) 875-125 MG tablet   Other Visit Diagnoses       Flu-like symptoms    -  Primary   Relevant Orders   POCT Influenza A/B (Completed)   POC COVID-19 (Completed)       No follow-ups on file.      Meds ordered this encounter  Medications   amoxicillin-clavulanate (AUGMENTIN) 875-125 MG tablet    Sig: Take 1 tablet by mouth 2 (two) times daily for 5 days.    Dispense:  10 tablet    Refill:  0    Orders Placed This Encounter  Procedures   POCT Influenza A/B   POC COVID-19    Previously tested for COVID-19:   No    Resident in a congregate (group) care setting:   No    Employed in healthcare setting:   No    Pregnant:   No      Charlton Amor, DO  [facility] Primary Care & Sports Medicine at [facility] [phone] (phone) [phone] (fax)  [facility]

## 2023-02-05 NOTE — Progress Notes (Signed)
Pt reports flu like symptoms since Monday , pt states states she has a metallic taste in her mouth and low grade temp and green and black mucus  and body aches.

## 2023-02-05 NOTE — Assessment & Plan Note (Signed)
Pt neg for covid and flu - have gone ahead and sent in for augmentin as I do believe this is a sinobronchitis given congestion symptoms and cough

## 2023-02-08 DIAGNOSIS — I471 Supraventricular tachycardia, unspecified: Secondary | ICD-10-CM

## 2023-02-10 ENCOUNTER — Ambulatory Visit: Payer: Commercial Managed Care - HMO | Admitting: Family Medicine

## 2023-02-26 ENCOUNTER — Encounter: Payer: Self-pay | Admitting: Family Medicine

## 2023-02-27 ENCOUNTER — Telehealth (INDEPENDENT_AMBULATORY_CARE_PROVIDER_SITE_OTHER): Payer: No Typology Code available for payment source | Admitting: Medical-Surgical

## 2023-02-27 ENCOUNTER — Encounter: Payer: Self-pay | Admitting: Medical-Surgical

## 2023-02-27 DIAGNOSIS — J324 Chronic pansinusitis: Secondary | ICD-10-CM | POA: Diagnosis not present

## 2023-02-27 MED ORDER — DOXYCYCLINE HYCLATE 100 MG PO TABS: 100.0000 mg | ORAL_TABLET | Freq: Two times a day (BID) | ORAL | 0 refills | Status: AC

## 2023-02-27 MED ORDER — PREDNISONE 50 MG PO TABS: 50.0000 mg | ORAL_TABLET | Freq: Every day | ORAL | 0 refills | Status: AC

## 2023-02-27 NOTE — Telephone Encounter (Signed)
 I tried calling patient twice this morning.

## 2023-02-27 NOTE — Progress Notes (Signed)
 Virtual Visit via Video Note  I connected with Julie Morris on 02/27/23 at  1:00 PM EST by a video enabled telemedicine application and verified that I am speaking with the correct person using two identifiers.   I discussed the limitations of evaluation and management by telemedicine and the availability of in person appointments. The patient expressed understanding and agreed to proceed.  Patient location: home Provider locations: office  Subjective:    CC: Continued sinus symptoms  HPI: Pleasant 48 year old female presenting via MyChart video visit with complaints of continued sinus symptoms.  She has been having upper respiratory symptoms for the past 3 weeks.  She was tested for COVID and flu with negative results.  She had been treated with Augmentin  for sinobronchitis however this did not provide any benefit.  She was also given cough medications which were moderately helpful.  Today she reports that her symptoms now include thick green nasal discharge that is at times slightly bloody.  She has significant postnasal drip which causes her to awake choking at night.  She continues to have a sore throat along with ear pain.  Maxillary facial pain and tenderness.  Has a frontal headache.  Endorses frequent periods of shortness of breath as well as some nausea.  Has been sneezing which is very painful.  On review of the chart, she has been diagnosed with chronic sinusitis by ENT that has been resistant to amoxicillin /Augmentin  in the past.  Patient reports that she does best with doxycycline  but almost always has to have a burst of prednisone .  Past medical history, Surgical history, Family history not pertinant except as noted below, Social history, Allergies, and medications have been entered into the medical record, reviewed, and corrections made.   Review of Systems: See HPI for pertinent positives and negatives.   Objective:    General: Speaking clearly in complete sentences without any  shortness of breath.  Alert and oriented x3.  Normal judgment. No apparent acute distress.  Impression and Recommendations:    1. Chronic pansinusitis (Primary) As her symptoms did not respond to Augmentin  and have progressively worsened, we will go ahead and treat with doxycycline  twice daily x 10 days along with a prednisone  50 mg burst for 5 days.  Okay to continue conservative measures at home.  If symptoms do not improve with these medications, advised her to reach back out to our office.  I discussed the assessment and treatment plan with the patient. The patient was provided an opportunity to ask questions and all were answered. The patient agreed with the plan and demonstrated an understanding of the instructions.   The patient was advised to call back or seek an in-person evaluation if the symptoms worsen or if the condition fails to improve as anticipated.  Return if symptoms worsen or fail to improve.  Zada FREDRIK Palin, DNP, APRN, FNP-BC [facility]

## 2023-02-27 NOTE — Telephone Encounter (Signed)
 Patient scheduled.

## 2023-03-04 ENCOUNTER — Ambulatory Visit: Payer: No Typology Code available for payment source | Admitting: Family Medicine

## 2023-03-06 ENCOUNTER — Encounter: Payer: Self-pay | Admitting: Family Medicine

## 2023-04-14 ENCOUNTER — Ambulatory Visit: Payer: No Typology Code available for payment source | Attending: Cardiology | Admitting: Cardiology

## 2023-04-14 DIAGNOSIS — Z91199 Patient's noncompliance with other medical treatment and regimen due to unspecified reason: Secondary | ICD-10-CM

## 2023-04-14 NOTE — Progress Notes (Unsigned)
  Cardiology Office Note:  .   Date:  04/14/2023  ID:  Julie Morris, DOB 1975/02/01, MRN [id number] PCP: Charlton Amor, DO  [facility] Providers Cardiologist:  Truett Mainland, MD PCP: Charlton Amor, DO  No chief complaint on file.    Julie Morris is a 48 y.o. female with SVT  Discussed the use of AI scribe software for clinical note transcription with the patient, who gave verbal consent to proceed.  History of Present Illness       There were no vitals filed for this visit.    ROS      Studies Reviewed: .        *** Independently interpreted 01/2023: Chol ***, TG ***, HDL ***, LDL *** HbA1C ***% Hb *** Cr 0.78 TSH 3.0 normal, free T4 0.81  Long term monitor 02/2023: Patch Wear Time:  5 days and 14 hours   HR 51 - 145, average 84 bpm. 2 nonsustained SVT (longest 30 beats) No atrial fibrillation detected. Rare supraventricular ectopy. Rare ventricular ectopy. No sustained arrhythmias. Symptom trigger episodes correspond to sinus rhythm  Risk Assessment/Calculations:   {Does this patient have ATRIAL FIBRILLATION?:[id number]}    Physical Exam   VISIT DIAGNOSES:   ICD-10-CM   1. SVT (supraventricular tachycardia) (HCC)  I47.10        Julie Morris is a 48 y.o. female with *** Assessment and Plan Assessment & Plan       {Are you ordering a CV Procedure (e.g. stress test, cath, DCCV, TEE, etc)?   Press F2        :[id number]}    No orders of the defined types were placed in this encounter.    F/u in ***  Signed, Elder Negus, MD

## 2023-05-28 ENCOUNTER — Encounter: Payer: Self-pay | Admitting: Family Medicine

## 2023-09-04 ENCOUNTER — Ambulatory Visit
